# Patient Record
Sex: Female | Born: 1955 | Race: White | Hispanic: No | Marital: Single | Smoking: Never smoker
Health system: Southern US, Academic
[De-identification: ages and names within clinical notes are randomized; demographics above are authoritative.]

## PROBLEM LIST (undated history)

## (undated) DIAGNOSIS — E079 Disorder of thyroid, unspecified: Secondary | ICD-10-CM

## (undated) DIAGNOSIS — I4891 Unspecified atrial fibrillation: Secondary | ICD-10-CM

## (undated) DIAGNOSIS — E669 Obesity, unspecified: Secondary | ICD-10-CM

## (undated) HISTORY — DX: Unspecified atrial fibrillation (CMS HCC): I48.91

## (undated) HISTORY — PX: HX CATARACT REMOVAL: SHX102

## (undated) HISTORY — PX: HX TONSILLECTOMY: SHX27

---

## 1996-09-16 ENCOUNTER — Ambulatory Visit (HOSPITAL_COMMUNITY): Payer: Self-pay | Admitting: Family Medicine

## 2007-02-05 ENCOUNTER — Ambulatory Visit (INDEPENDENT_AMBULATORY_CARE_PROVIDER_SITE_OTHER): Payer: 59 | Admitting: Ophthalmology

## 2007-02-05 ENCOUNTER — Ambulatory Visit
Admission: RE | Admit: 2007-02-05 | Discharge: 2007-02-05 | Disposition: A | Discharge status: Home / Self Care | Attending: Ophthalmology | Admitting: Ophthalmology

## 2007-02-05 ENCOUNTER — Ambulatory Visit (INDEPENDENT_AMBULATORY_CARE_PROVIDER_SITE_OTHER): Admitting: Ophthalmology

## 2007-02-05 DIAGNOSIS — H43819 Vitreous degeneration, unspecified eye: Secondary | ICD-10-CM | POA: Insufficient documentation

## 2007-02-05 DIAGNOSIS — H547 Unspecified visual loss: Secondary | ICD-10-CM | POA: Insufficient documentation

## 2007-02-05 DIAGNOSIS — H25049 Posterior subcapsular polar age-related cataract, unspecified eye: Secondary | ICD-10-CM | POA: Insufficient documentation

## 2007-02-05 DIAGNOSIS — E119 Type 2 diabetes mellitus without complications: Secondary | ICD-10-CM | POA: Insufficient documentation

## 2007-02-05 DIAGNOSIS — E039 Hypothyroidism, unspecified: Secondary | ICD-10-CM | POA: Insufficient documentation

## 2007-02-05 DIAGNOSIS — H43399 Other vitreous opacities, unspecified eye: Secondary | ICD-10-CM | POA: Insufficient documentation

## 2007-03-03 ENCOUNTER — Ambulatory Visit
Admission: RE | Admit: 2007-03-03 | Discharge: 2007-03-03 | Disposition: A | Discharge status: Home / Self Care | Payer: 59 | Attending: Ophthalmology | Admitting: Ophthalmology

## 2007-03-03 ENCOUNTER — Encounter (HOSPITAL_COMMUNITY): Payer: Self-pay

## 2007-03-19 ENCOUNTER — Encounter (HOSPITAL_COMMUNITY)
Admission: RE | Disposition: A | Discharge status: Home / Self Care | Payer: Self-pay | Source: Ambulatory Visit | Attending: Ophthalmology

## 2007-03-19 ENCOUNTER — Ambulatory Visit (INDEPENDENT_AMBULATORY_CARE_PROVIDER_SITE_OTHER): Payer: 59 | Admitting: Ophthalmology

## 2007-03-19 ENCOUNTER — Inpatient Hospital Stay
Admission: RE | Admit: 2007-03-19 | Discharge: 2007-03-19 | Disposition: A | Discharge status: Home / Self Care | Payer: 59 | Attending: Ophthalmology | Admitting: Ophthalmology

## 2007-03-19 ENCOUNTER — Encounter (HOSPITAL_BASED_OUTPATIENT_CLINIC_OR_DEPARTMENT_OTHER): Admitting: Ophthalmology

## 2007-03-19 DIAGNOSIS — Z794 Long term (current) use of insulin: Secondary | ICD-10-CM | POA: Insufficient documentation

## 2007-03-19 DIAGNOSIS — E119 Type 2 diabetes mellitus without complications: Secondary | ICD-10-CM | POA: Insufficient documentation

## 2007-03-19 DIAGNOSIS — E669 Obesity, unspecified: Secondary | ICD-10-CM | POA: Insufficient documentation

## 2007-03-19 DIAGNOSIS — H2510 Age-related nuclear cataract, unspecified eye: Secondary | ICD-10-CM | POA: Insufficient documentation

## 2007-03-19 DIAGNOSIS — E079 Disorder of thyroid, unspecified: Secondary | ICD-10-CM | POA: Insufficient documentation

## 2007-03-19 LAB — PERFORM POC WHOLE BLOOD GLUCOSE: GLUCOSE, POINT OF CARE: 212 mg/dL — ABNORMAL HIGH (ref 70–105)

## 2007-03-19 SURGERY — PHACO WITH INTRAOCULAR LENS
Anesthesia: MAC TOPICAL | Site: Eye | Laterality: Right | Wound class: Clean Wound: Uninfected operative wounds in which no inflammation occurred

## 2007-03-19 MED ORDER — LACTATED RINGERS INTRAVENOUS SOLUTION
INTRAVENOUS | Status: DC
Start: 2007-03-19 — End: 2007-03-19

## 2007-03-19 MED ORDER — BALANCED SALT SOLUTION COMBINATION NO.2 INTRAOCULAR IRRIGATION
500.0000 mL | Freq: Once | INTRAOCULAR | Status: AC
Start: 2007-03-19 — End: 2007-03-19
  Administered 2007-03-19: 500 mL via OPHTHALMIC
  Filled 2007-03-19: qty 15

## 2007-03-19 MED ORDER — HELP MEDICATION
1.00 | Freq: Once | Status: AC
Start: 2007-03-19 — End: 2007-03-19
  Administered 2007-03-19: 1 via INTRAMUSCULAR
  Filled 2007-03-19: qty 1

## 2007-03-19 MED ORDER — PREDNISOLONE ACETATE 1 % EYE DROPS,SUSPENSION
1.00 [drp] | Freq: Four times a day (QID) | OPHTHALMIC | Status: DC
Start: 2007-03-19 — End: 2007-04-29

## 2007-03-19 MED ORDER — CYCLOPENTOLATE 1 % EYE DROPS
1.00 [drp] | OPHTHALMIC | Status: AC
Start: 2007-03-19 — End: 2007-03-19
  Administered 2007-03-19 (×3): 1 [drp] via OPHTHALMIC

## 2007-03-19 MED ORDER — MOXIFLOXACIN 0.5 % EYE DROPS
1.00 [drp] | OPHTHALMIC | Status: AC
Start: 2007-03-19 — End: 2007-03-19
  Administered 2007-03-19 (×3): 1 [drp] via OPHTHALMIC

## 2007-03-19 MED ORDER — MOXIFLOXACIN 0.5 % EYE DROPS
1.00 [drp] | Freq: Four times a day (QID) | OPHTHALMIC | Status: DC
Start: 2007-03-19 — End: 2007-04-29

## 2007-03-19 MED ORDER — BALANCED SALT SOLUTION COMBINATION NO.2 INTRAOCULAR IRRIGATION
15.0000 mL | Freq: Once | INTRAOCULAR | Status: AC
Start: 2007-03-19 — End: 2007-03-19
  Administered 2007-03-19: 15 mL via OPHTHALMIC
  Filled 2007-03-19: qty 15

## 2007-03-19 MED ORDER — PHENYLEPHRINE 2.5 % EYE DROPS
1.00 [drp] | Freq: Once | OPHTHALMIC | Status: AC
Start: 2007-03-19 — End: 2007-03-19
  Administered 2007-03-19 (×3): 1 [drp] via OPHTHALMIC

## 2007-03-19 SURGICAL SUPPLY — 6 items
GARTER EYE ASST_28-6536 (OPHTHALMIC SUPPLIES (NOT LENS)) ×1 IMPLANT
LENS IOL 10 D +19 DIOP MOD C BICONVEX ACRYSOF MLPC FLD ANT  ASYM UV ABS MNFLX 13MM 6MM POST CHAMBER (Lens) ×1 IMPLANT
PACK CATARACT BX/6 (TRAY) ×1 IMPLANT
PAD EYE 2.62X1.62IN SURECARE COTTON ABS LF  STRL (OPHTHALMIC SUPPLIES (NOT LENS)) IMPLANT
PEN SURG MRKNG WRITESITE + RLR LBL SET 3X DARKER FORMULATE GNTN VIOL INK STRL LF  CHLRPRP (MISCELLANEOUS PT CARE ITEMS) ×1 IMPLANT
SHIELD EYE 76.2X60.3MM ASST AL EY GRTR FOX CVR (OPHTHALMIC SUPPLIES (NOT LENS)) ×1 IMPLANT

## 2007-03-19 NOTE — OR Nursing (Signed)
Cde   40.60

## 2007-03-19 NOTE — Discharge Instructions (Signed)
SURGICAL DISCHARGE INSTRUCTIONS     Dr. Brett Fairy  performed your [facility] WITH INTRAOCULAR LENS today at the [facility]    [facility]:  Monday through Friday from 6 a.m. - 7 p.m.: [phone]  Between 7 p.m. - 6 a.m., weekends and holidays:  Call [facility] at [phone] or [phone].    PLEASE SEE WRITTEN HANDOUTS AS DISCUSSED BY YOUR NURSE:  cataract    SIGNS AND SYMPTOMS OF A WOUND / INCISION INFECTION   Be sure to watch for the following:   Increase in redness or red streaks near or around the wound or incision.  Increase in pain that is intense or severe and cannot be relieved by the pain medication that your doctor has given you.  Increase in swelling that cannot be relieved by elevation of a body part, or by applying ice, if permitted.  Increase in drainage, or if yellow / green in color and smells bad. This could be on a dressing or a cast.  Increase in fever for longer than 24 hours, or an increase that is higher than 101 degrees Fahrenheit (normal body temperature is 98 degrees Fahrenheit). The incision may feel warm to the touch.    **CALL YOUR DOCTOR IF ONE OR MORE OF THESE SIGNS / SYMPTOMS SHOULD OCCUR.    ANESTHESIA INFORMATION   LOCAL ANESTHETIC:  You have receieved a local anesthetic, the effects should disappear in a few hours.    REMEMBER   If you experience any difficulty breathing, chest pain, bleeding that you feel is excessive, persistent nausea or vomiting or for any other concerns:  Call your physician Dr. Sheppard Evens [phone] or [phone]. You may also ask to have theOphthalmology doctor on call paged. They are available to you 24 hours a day.    SPECIAL INSTRUCTIONS / COMMENTS   Start drops today    FOLLOW-UP APPOINTMENTS   Please call patient services at [phone] or [phone] to schedule a date / time of return. They are open Monday - Friday from 7:30 am - 5:00 pm.

## 2007-03-19 NOTE — OR Surgeon (Signed)
[facility]   DEPARTMENT OF OPHTHALMOLOGY    OPERATION SUMMARY    PATIENT NAME: Rachel Mccall, Rachel Mccall  HOSPITAL ZOXWRU:[id number]  DATE OF SERVICE:03/19/2007  DATE OF BIRTH: 10/26/1955    PREOPERATIVE DIAGNOSIS: Cataract, right eye.    POSTOPERATIVE DIAGNOSIS: Cataract, right eye.    NAME OF PROCEDURE: Phacoemulsification of cataract with posterior chamber lens implant, right eye.    SURGEONS: Kathleen Argue MD (staff), Aurther Loft MD    ANESTHESIA: MAC, topical.    ESTIMATED BLOOD LOSS: Less than 2 mL.    COMPLICATIONS: There were no complications.    SPECIMENS: None.    DESCRIPTION OF PROCEDURE: In the preanesthesia area, the patient was given a topical anesthetic consisting of 2% Xylocaine jelly applied to the right eye. The patient was taken back to the operating room suite, where the right eye was prepped and draped in a routine sterile fashion. A speculum was placed in the eye. A keratome was used to make a 1-mm side port entry through clear cornea at the 11 o'clock position along the limbus, and 1 mL of 1% preservative-free Xylocaine was then irrigated into the anterior chamber. The anterior chamber was filled with viscoelastic material. The keratome was used to make a 2.4-mm phaco port incision through clear cornea at the 9 o'clock position along the limbus. The cystotome and capsulorrhexis forceps were used to create a continuous curvilinear capsulorrhexis. Hydrodissection was performed. The tip of the phacoemulsification handpiece was introduced into the eye, and the lens nucleus was emulsified using a bimanual nuclear splitting technique. The cumulative dispersive energy was 40.60. The phaco tip was removed and the tip of the I/A handpiece introduced into the eye. The remaining lens cortical material was aspirated from the eye. The I/A tip was removed, and the capsular bag was inflated with viscoelastic material. The posterior chamber lens implant of +19.0 diopters power, [id number],  serial #[id number], was brought onto the field and inspected. It was without defect and so loaded into the lens injector. The tip of the injector was inserted into the eye, and the lens implant was placed in the capsular bag. The injector tip was removed and the tip of the I/A handpiece reintroduced into the eye. The remaining viscoelastic material was aspirated from the eye. The I/A tip was removed, and the anterior chamber was pressurized with balanced salt solution. Weck-cel sponges confirmed no leak at the incision, so no sutures were necessary. The speculum was removed from the eye, and the eye was bandaged with a rigid shield. The patient was returned to same day surgery in excellent condition. Dr. Christell Constant was present for and participated in the entire procedure.      Aurther Loft, MD  Resident/[facility]    Brett Fairy, MD  Assistant Professor  [facility]hthal[facility]    MM/fkp/[id number];D: 03/19/2007 09:14:00; T: 03/19/2007 19:29:33

## 2007-03-26 ENCOUNTER — Encounter (INDEPENDENT_AMBULATORY_CARE_PROVIDER_SITE_OTHER): Payer: 59 | Admitting: Ophthalmology

## 2007-04-28 ENCOUNTER — Encounter (HOSPITAL_COMMUNITY): Payer: Self-pay

## 2007-04-29 ENCOUNTER — Encounter (HOSPITAL_BASED_OUTPATIENT_CLINIC_OR_DEPARTMENT_OTHER): Payer: 59 | Admitting: Ophthalmology

## 2007-04-29 ENCOUNTER — Encounter (HOSPITAL_COMMUNITY)
Admission: RE | Disposition: A | Discharge status: Home / Self Care | Payer: Self-pay | Source: Ambulatory Visit | Attending: Ophthalmology

## 2007-04-29 ENCOUNTER — Inpatient Hospital Stay
Admission: RE | Admit: 2007-04-29 | Discharge: 2007-04-29 | Disposition: A | Discharge status: Home / Self Care | Payer: 59 | Attending: Ophthalmology | Admitting: Ophthalmology

## 2007-04-29 DIAGNOSIS — Z794 Long term (current) use of insulin: Secondary | ICD-10-CM | POA: Insufficient documentation

## 2007-04-29 DIAGNOSIS — E669 Obesity, unspecified: Secondary | ICD-10-CM | POA: Insufficient documentation

## 2007-04-29 DIAGNOSIS — H269 Unspecified cataract: Secondary | ICD-10-CM | POA: Insufficient documentation

## 2007-04-29 DIAGNOSIS — E119 Type 2 diabetes mellitus without complications: Secondary | ICD-10-CM | POA: Insufficient documentation

## 2007-04-29 DIAGNOSIS — E079 Disorder of thyroid, unspecified: Secondary | ICD-10-CM | POA: Insufficient documentation

## 2007-04-29 HISTORY — DX: Disorder of thyroid, unspecified: E07.9

## 2007-04-29 HISTORY — DX: Obesity, unspecified: E66.9

## 2007-04-29 LAB — PERFORM POC WHOLE BLOOD GLUCOSE: GLUCOSE, POINT OF CARE: 226 mg/dL — ABNORMAL HIGH (ref 70–105)

## 2007-04-29 SURGERY — PHACO WITH INTRAOCULAR LENS
Anesthesia: MAC TOPICAL | Site: Eye | Laterality: Left | Wound class: Clean Wound: Uninfected operative wounds in which no inflammation occurred

## 2007-04-29 MED ORDER — LIDOCAINE HCL 2 % MUCOSAL JELLY
Freq: Once | Status: AC
Start: 2007-04-29 — End: 2007-04-29

## 2007-04-29 MED ORDER — CYCLOPENTOLATE 1 % EYE DROPS
1.00 [drp] | OPHTHALMIC | Status: DC | PRN
Start: 2007-04-29 — End: 2007-04-29
  Administered 2007-04-29 (×3): 1 [drp] via OPHTHALMIC

## 2007-04-29 MED ORDER — MOXIFLOXACIN 0.5 % EYE DROPS
1.00 [drp] | Freq: Four times a day (QID) | OPHTHALMIC | Status: DC
Start: 2007-04-29 — End: 2007-06-25

## 2007-04-29 MED ORDER — MOXIFLOXACIN 0.5 % EYE DROPS
1.00 [drp] | OPHTHALMIC | Status: DC | PRN
Start: 2007-04-29 — End: 2007-04-29
  Administered 2007-04-29 (×3): 1 [drp] via OPHTHALMIC

## 2007-04-29 MED ORDER — PHENYLEPHRINE 2.5 % EYE DROPS
1.00 [drp] | OPHTHALMIC | Status: DC | PRN
Start: 2007-04-29 — End: 2007-04-29
  Administered 2007-04-29 (×3): 1 [drp] via OPHTHALMIC

## 2007-04-29 MED ORDER — BALANCED SALT SOLUTION COMBINATION NO.2 INTRAOCULAR IRRIGATION
15.00 mL | Freq: Once | INTRAOCULAR | Status: DC | PRN
Start: 2007-04-29 — End: 2007-04-29
  Administered 2007-04-29: 15 mL via OPHTHALMIC
  Filled 2007-04-29: qty 15

## 2007-04-29 MED ORDER — BALANCED SALT SOLUTION COMBINATION NO.2 INTRAOCULAR IRRIGATION
INTRAOCULAR | Status: DC
Start: 2007-04-29 — End: 2007-04-29
  Administered 2007-04-29: 500 mL via INTRAOCULAR

## 2007-04-29 MED ORDER — LACTATED RINGERS INTRAVENOUS SOLUTION
INTRAVENOUS | Status: DC
Start: 2007-04-29 — End: 2007-04-29

## 2007-04-29 MED ORDER — PREDNISOLONE ACETATE 1 % EYE DROPS,SUSPENSION
1.00 [drp] | Freq: Four times a day (QID) | OPHTHALMIC | Status: DC
Start: 2007-04-29 — End: 2007-06-25

## 2007-04-29 SURGICAL SUPPLY — 5 items
GARTER EYE ASST_28-6536 (OPHTHALMIC SUPPLIES (NOT LENS)) ×1 IMPLANT
LENS IOL 10 D +20.5 DIOP MOD C BICONVEX ACRYSOF MLPC FLD ANT  ASYM UV ABS MNFLX 13MM 6MM POST (Lens) ×1 IMPLANT
PACK CATARACT BX/6 (TRAY) ×1 IMPLANT
PAD EYE 2.62X1.62IN SURECARE COTTON ABS LF  STRL (OPHTHALMIC SUPPLIES (NOT LENS)) ×2 IMPLANT
SHIELD EYE 76.2X60.3MM ASST AL EY GRTR FOX CVR (OPHTHALMIC SUPPLIES (NOT LENS)) ×1 IMPLANT

## 2007-04-29 NOTE — OR Nursing (Signed)
CDE  49.63  DUOVISC 1.05 ML INTRAOCCULAR TO LEFT EYE BY [place]

## 2007-04-29 NOTE — OR PostOp (Signed)
Left via W/C with all belongings.  No changes/stable. Per D.Sharpton SA

## 2007-04-29 NOTE — Discharge Instructions (Signed)
SURGICAL DISCHARGE INSTRUCTIONS     Dr. Charles A Moore  performed your [facility] WITH INTRAOCULAR LENS today at the [facility]    [facility]:  Monday through Friday from 6 a.m. - 7 p.m.: [phone]  Between 7 p.m. - 6 a.m., weekends and holidays:  Call [facility] at [phone] or [phone].    PLEASE SEE WRITTEN HANDOUTS AS DISCUSSED BY YOUR NURSE:  Cataract surgery    SIGNS AND SYMPTOMS OF A WOUND / INCISION INFECTION   Be sure to watch for the following:  · Increase in redness or red streaks near or around the wound or incision.  Increase in pain that is intense or severe and cannot be relieved by the pain medication that your doctor has given you.  Increase in swelling that cannot be relieved by elevation of a body part, or by applying ice, if permitted.  Increase in drainage, or if yellow / green in color and smells bad. This could be on a dressing or a cast.  Increase in fever for longer than 24 hours, or an increase that is higher than 101 degrees Fahrenheit (normal body temperature is 98 degrees Fahrenheit). The incision may feel warm to the touch.    **CALL YOUR DOCTOR IF ONE OR MORE OF THESE SIGNS / SYMPTOMS SHOULD OCCUR.    ANESTHESIA INFORMATION   ANESTHESIA -- ADULT PATIENTS:  You have received intravenous sedation / general anesthesia, and you may feel drowsy and light-headed for several hours. You may even experience some forgetfulness of the procedure. DO NOT DRIVE A MOTOR VEHICLE or perform any activity requiring complete alertness or coordination until you feel fully awake in about 24-48 hours. Do not drink alcoholic beverages for at least 24 hours. Do not stay alone, you must have a responsible adult available to be with you. You may also experience a dry mouth or nausea for 24 hours. This is a normal side effect and will disappear as the effects of the medication wear off.    REMEMBER   If you experience any difficulty breathing, chest pain, bleeding that you  feel is excessive, persistent nausea or vomiting or for any other concerns:  Call your physician Dr. Moore at [phone] or [phone]. You may also ask to have the eye doctor on call paged. They are available to you 24 hours a day.    SPECIAL INSTRUCTIONS / COMMENTS       FOLLOW-UP APPOINTMENTS   Please call patient services at [phone] or [phone] to schedule a date / time of return. They are open Monday - Friday from 7:30 am - 5:00 pm.

## 2007-04-29 NOTE — OR Surgeon (Signed)
[facility]                                  DEPARTMENT OF OPHTHALMOLOGY                                         OPERATION SUMMARY    PATIENT NAME: Rachel Mccall, Rachel Mccall  HOSPITAL ZOXWRU:[id number]  DATE OF SERVICE:04/29/2007  DATE OF BIRTH: July 24, 1955    PREOPERATIVE DIAGNOSIS:  Cataract, left eye.     POSTOPERATIVE DIAGNOSIS:  Cataract, left eye.     NAME OF PROCEDURE:  Phacoemulsification of cataract with posterior chamber lens implant, left eye.     SURGEONS:  Kathleen Argue MD (staff), Drue Stager MD (resident).     ANESTHESIA:  Lidocaine gel with MAC.     ESTIMATED BLOOD LOSS:  Less than 2 mL.     COMPLICATIONS:  There were no complications.     SPECIMENS:  None.     DESCRIPTION OF PROCEDURE:  In the preanesthesia area, the patient was given a topical anesthetic consisting of 2% Xylocaine jelly applied to the left eye.  The patient was taken back to the operating room suite, where the left eye was prepped and draped in a routine sterile fashion.  A speculum was placed in the eye.  A keratome was used to make a 1-mm side port entry through clear cornea at the 5 o'clock position along the limbus, and 1 mL of 1% preservative-free Xylocaine was then irrigated into the anterior chamber.  The anterior chamber was filled with viscoelastic material.  The keratome was used to make a 2.4-mm phaco port incision through clear cornea at the 3 o'clock position along the limbus.  The cystotome and capsulorrhexis forceps were used to create a continuous curvilinear capsulorrhexis.  Hydrodissection was performed.  The tip of the phacoemulsification handpiece was introduced into the eye, and the lens nucleus was emulsified using a bimanual nuclear splitting technique.  The cumulative dispersive energy was 49.63.  The phaco tip was removed and the tip of the I/A handpiece introduced into the eye.  The remaining lens cortical material was aspirated from the eye.  The I/A tip was  removed, and the capsular bag was inflated with viscoelastic material.  The posterior chamber lens implant of 25.5 diopters power was brought onto the field and inspected.  It was without defect and so loaded into the lens injector.  The tip of the injector was inserted into the eye, and the lens implant was placed in the capsular bag.  The injector tip was removed and the tip of the I/A handpiece reintroduced into the eye.  The remaining viscoelastic material was aspirated from the eye.  The I/A tip was removed, and the anterior chamber was pressurized with balanced salt solution.  Weck-cel sponges confirmed no leak at the incision, so no sutures were necessary.  The speculum was removed from the eye, and the eye was bandaged with a rigid shield.  The patient was returned to same day surgery in excellent condition.  Dr. Christell Constant was present for and participated in the entire procedure.      Drue Stager, MD  Resident  [facility] of Ophthalmology    Brett Fairy, MD  Assistant Professor  [facility] Op[facility]    HE/csm/[id number];D: 04/29/2007 12:00:09; T:  04/29/2007 12:20:45

## 2007-04-30 ENCOUNTER — Ambulatory Visit: Payer: 59 | Attending: Ophthalmology | Admitting: Ophthalmology

## 2007-05-07 ENCOUNTER — Encounter (INDEPENDENT_AMBULATORY_CARE_PROVIDER_SITE_OTHER): Payer: 59 | Admitting: Ophthalmology

## 2007-06-25 ENCOUNTER — Ambulatory Visit: Payer: 59 | Admitting: Ophthalmology

## 2007-07-06 ENCOUNTER — Ambulatory Visit (HOSPITAL_COMMUNITY): Payer: Self-pay

## 2009-03-27 ENCOUNTER — Ambulatory Visit (INDEPENDENT_AMBULATORY_CARE_PROVIDER_SITE_OTHER): Admitting: Otolaryngology

## 2009-05-22 ENCOUNTER — Encounter (INDEPENDENT_AMBULATORY_CARE_PROVIDER_SITE_OTHER): Payer: Self-pay | Admitting: Otolaryngology

## 2009-05-22 ENCOUNTER — Ambulatory Visit (INDEPENDENT_AMBULATORY_CARE_PROVIDER_SITE_OTHER): Payer: 59 | Admitting: Otolaryngology

## 2009-05-22 VITALS — Ht 68.0 in | Wt 314.0 lb

## 2009-05-22 NOTE — H&P (Signed)
See dictated note.

## 2009-05-23 NOTE — Progress Notes (Signed)
[facility] Department of Otolaryngology  [address]  [place], [place] [address]      PROGRESS NOTE    PATIENT NAME: Rachel Mccall, Rachel Mccall  CHART NUMBER: [id number]  DATE OF BIRTH: [birth year]  DATE OF SERVICE: 05/22/2009    [facility] ENT  [facility]  [address]   [place], [place] [address]   Telephone: [phone]   Fax: [phone]     SUBJECTIVE: This lady has had a history of ear infections that has been going on for 3-4 months with some dizziness when these occur. She was told she had ear fluid and was treated with antibiotic therapy. She is diabetic with dietary control and does take meclizine as needed. This is more of a lightheadedness. She does state some degree of ear pain when these episodes occur.    OBJECTIVE: Alert, cooperative. Head: Symmetrical. Eyes unremarkable, no horizontal nystagmus. Gait and balance are all clear. Ears: TMs are clear. Nasal area patent and open. Mouth and oral cavity unremarkable. Nasopharynx: No exudate. Indirect laryngoscopy unremarkable. Neck: No adenopathy or salivary gland enlargement.    ASSESSMENT: Essentially normal ENT exam at this time.    PLAN: We will see her back in five months or if she has another episode, and we can try to assemble the facts at that time and see what is happening. She understands and will return as scheduled.      Vilinda Flake, MD  Assistant Professor  [facility] Department of Otolaryngology    JJ/OA/[id number]; D: 05/22/2009 14:14:22; T: 05/22/2009 14:31:05

## 2009-09-25 ENCOUNTER — Encounter (INDEPENDENT_AMBULATORY_CARE_PROVIDER_SITE_OTHER): Payer: 59 | Admitting: Otolaryngology

## 2009-10-30 ENCOUNTER — Encounter (INDEPENDENT_AMBULATORY_CARE_PROVIDER_SITE_OTHER): Admitting: Otolaryngology

## 2009-11-06 ENCOUNTER — Encounter (INDEPENDENT_AMBULATORY_CARE_PROVIDER_SITE_OTHER): Payer: Self-pay | Admitting: Otolaryngology

## 2011-01-28 HISTORY — PX: HX ANKLE FRACTURE TX: SHX122

## 2012-09-03 HISTORY — PX: HX YAG: 210000615

## 2012-09-09 HISTORY — PX: HX YAG: 210000615

## 2014-01-17 DIAGNOSIS — I87332 Chronic venous hypertension (idiopathic) with ulcer and inflammation of left lower extremity: Secondary | ICD-10-CM

## 2014-01-17 DIAGNOSIS — L97821 Non-pressure chronic ulcer of other part of left lower leg limited to breakdown of skin: Secondary | ICD-10-CM

## 2014-01-24 DIAGNOSIS — L97821 Non-pressure chronic ulcer of other part of left lower leg limited to breakdown of skin: Secondary | ICD-10-CM

## 2014-02-15 DIAGNOSIS — L97821 Non-pressure chronic ulcer of other part of left lower leg limited to breakdown of skin: Secondary | ICD-10-CM

## 2014-02-22 DIAGNOSIS — L97821 Non-pressure chronic ulcer of other part of left lower leg limited to breakdown of skin: Secondary | ICD-10-CM

## 2014-03-01 DIAGNOSIS — L97821 Non-pressure chronic ulcer of other part of left lower leg limited to breakdown of skin: Secondary | ICD-10-CM

## 2014-03-22 DIAGNOSIS — L97821 Non-pressure chronic ulcer of other part of left lower leg limited to breakdown of skin: Secondary | ICD-10-CM

## 2014-12-07 DIAGNOSIS — L97821 Non-pressure chronic ulcer of other part of left lower leg limited to breakdown of skin: Secondary | ICD-10-CM

## 2014-12-07 DIAGNOSIS — I87332 Chronic venous hypertension (idiopathic) with ulcer and inflammation of left lower extremity: Secondary | ICD-10-CM

## 2014-12-14 DIAGNOSIS — L97821 Non-pressure chronic ulcer of other part of left lower leg limited to breakdown of skin: Secondary | ICD-10-CM

## 2014-12-28 DIAGNOSIS — L97821 Non-pressure chronic ulcer of other part of left lower leg limited to breakdown of skin: Secondary | ICD-10-CM

## 2015-01-18 DIAGNOSIS — L97821 Non-pressure chronic ulcer of other part of left lower leg limited to breakdown of skin: Secondary | ICD-10-CM

## 2015-01-23 DIAGNOSIS — L97821 Non-pressure chronic ulcer of other part of left lower leg limited to breakdown of skin: Secondary | ICD-10-CM

## 2015-01-30 DIAGNOSIS — L97821 Non-pressure chronic ulcer of other part of left lower leg limited to breakdown of skin: Secondary | ICD-10-CM

## 2015-02-08 DIAGNOSIS — L97821 Non-pressure chronic ulcer of other part of left lower leg limited to breakdown of skin: Secondary | ICD-10-CM

## 2015-02-15 DIAGNOSIS — L97821 Non-pressure chronic ulcer of other part of left lower leg limited to breakdown of skin: Secondary | ICD-10-CM

## 2015-02-22 DIAGNOSIS — L97821 Non-pressure chronic ulcer of other part of left lower leg limited to breakdown of skin: Secondary | ICD-10-CM

## 2015-03-01 DIAGNOSIS — L97821 Non-pressure chronic ulcer of other part of left lower leg limited to breakdown of skin: Secondary | ICD-10-CM

## 2015-03-08 DIAGNOSIS — L97821 Non-pressure chronic ulcer of other part of left lower leg limited to breakdown of skin: Secondary | ICD-10-CM

## 2015-03-16 DIAGNOSIS — L97821 Non-pressure chronic ulcer of other part of left lower leg limited to breakdown of skin: Secondary | ICD-10-CM

## 2015-03-22 DIAGNOSIS — L97821 Non-pressure chronic ulcer of other part of left lower leg limited to breakdown of skin: Secondary | ICD-10-CM

## 2015-03-29 DIAGNOSIS — L97821 Non-pressure chronic ulcer of other part of left lower leg limited to breakdown of skin: Secondary | ICD-10-CM

## 2015-04-06 ENCOUNTER — Ambulatory Visit (INDEPENDENT_AMBULATORY_CARE_PROVIDER_SITE_OTHER): Payer: Self-pay | Admitting: Medical

## 2015-04-06 ENCOUNTER — Ambulatory Visit (INDEPENDENT_AMBULATORY_CARE_PROVIDER_SITE_OTHER): Payer: No Typology Code available for payment source

## 2015-04-06 ENCOUNTER — Encounter (INDEPENDENT_AMBULATORY_CARE_PROVIDER_SITE_OTHER): Payer: Self-pay | Admitting: Medical

## 2015-04-06 VITALS — BP 152/84 | HR 75 | Ht 68.0 in | Wt 302.0 lb

## 2015-04-06 DIAGNOSIS — L97329 Non-pressure chronic ulcer of left ankle with unspecified severity: Secondary | ICD-10-CM

## 2015-04-06 DIAGNOSIS — I83023 Varicose veins of left lower extremity with ulcer of ankle: Secondary | ICD-10-CM

## 2015-04-06 DIAGNOSIS — I872 Venous insufficiency (chronic) (peripheral): Secondary | ICD-10-CM

## 2015-04-06 NOTE — Progress Notes (Addendum)
[facility]  [address]  [place] [address]  Phone: [phone]  Fax: [phone]    Encounter Date: 04/06/2015    Patient ID:  Rachel Mccall  QIO:[id number]    DOB: 07-27-1955  Age: 60 y.o. female    Subjective:     Chief Complaint   Patient presents with    Venous Peripheral Insufficiency     room 3; left leg unnaboot, no recent testing     HPI   Patient is a 60 y.o. female who was seen today for evaluation of lower extremity swelling and left ankle ulcer. She has a left ankle ulcer started after she broke her left ankle in September 2015. She says the ulcer will worsen intermittently It has never fully healed.  She was previously using Medihoney with some improvement. Since January, she has been following with Dr. Lanae Boast at the [facility]. She is currently on serial Unna boot therapy and she says that the ulcer is looking much better, but still continues to drain.        Current Outpatient Prescriptions   Medication Sig    glipiZIDE (GLUCOTROL) 5 mg Tab take 5 mg by mouth Daily before Breakfast. Take 30 minutes before meals     levothyroxine (SYNTHROID) 75 mcg Tab take 1 Tab by mouth Once a day.     lisinopril (PRINIVIL) 2.5 mg Tab take 2.5 mg by mouth Once a day.    metformin (GLUCOPHAGE) 500 mg Tab take 1 Tab by mouth Twice daily with food.      Allergies   Allergen Reactions    Bactrim [Sulfamethoxazole-Trimethoprim]     Penicillins Rash     Past Medical History:   Diagnosis Date    Diabetes     Obesity     Thyroid disease          Past Surgical History:   Procedure Laterality Date    HX CATARACT REMOVAL      OD    HX TONSILLECTOMY           Family History   Problem Relation Age of Onset    Diabetes Other     Thyroid Disease Other     Cancer Mother     Diabetes Father     Hypertension Father     Cancer Sister     Cancer Brother          Social History   Substance Use Topics    Smoking status: Never Smoker    Smokeless tobacco: None     Alcohol use 0.5 oz/week     1 Standard drinks or equivalent per week       Review of Systems      Constitutional Negative for: Fever           HENT Negative for: Headaches, Nosebleeds     Eyes Negative for: Blurred Vision     Cardiovascular Negative for: Chest Pain, Leg Swelling     Respiratory Negative for: Cough, Shortness of Breath     Gastrointestinal Negative for: Abdominal Pain, Blood in Stool     Genitourinary Negative for: Hematuria     Musculoskeletal Negative for: Falls     Endo/Heme/Allergy Negative for: Easy Bruise/Bleed     Neurological Negative for : Dizziness, Focal Weakness, Seizures, Speech Change     Psychological Negative for: Depression, Suicidal Ideas, Substance Abuse, Memory Loss      Objective:   Vitals:  Visit Vitals    BP (!) 152/84    Pulse 75    Ht 1.727 m (5\' 8" )    Wt (!) 137 kg (302 lb)    SpO2 96%    BMI 45.92 kg/m2       Physical Exam   Constitutional: She is oriented to person, place, and time. She appears well-developed and well-nourished. No distress.   Cardiovascular: Normal rate, regular rhythm, normal heart sounds and intact distal pulses.    Pulmonary/Chest: Effort normal and breath sounds normal. No respiratory distress. She has no wheezes. She has no rales.   Abdominal: Soft. Bowel sounds are normal.   Musculoskeletal: Normal range of motion. She exhibits edema.   Neurological: She is alert and oriented to person, place, and time. No cranial nerve deficit.   Skin: Skin is warm and dry. She is not diaphoretic.        Vitals reviewed.    Assessment & Plan:     ENCOUNTER DIAGNOSES     ICD-10-CM   1. Venous insufficiency of both lower extremities I87.2   2. 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 All of the patient's questions were answered. The patient agreed to proceed with the recommended treatment and appropriate consents were obtained.    Patient will continue follow-up for [facility] until surgery        Orders Placed This Encounter    VENOUS DUPLEX EXTREMITY/BILATERAL/COMPLETE (AMB ONLY)         Brandy Straface, PA-C    The history, physical exam was directly supervised by me. The interpretation of ancillary tests was performed by me. I also have  reviewed and confirmed the ROS, Past Medical Histoy, Past Family History, and exam elements performed and documented by the support staff. The scribed portion of the progress note was scribed on my behalf and at my direction.  I have reviewed and attest to the accuracy of the note.    Garfield CorneaJohn A Caydin Yeatts, MD FACS

## 2015-04-12 DIAGNOSIS — L97821 Non-pressure chronic ulcer of other part of left lower leg limited to breakdown of skin: Secondary | ICD-10-CM

## 2015-04-13 NOTE — Procedures (Signed)
PROCEDURE:  Bilateral lower extremity venous Ultrasound with color flow imaging and compression augmentation maneuvers was performed Using standard protocols. Bilateral Common Femoral, Femoral, Popliteal and deep tibial Veins Were Evaluated. The Greater Saphenous veins and the Lesser Saphenous Veins were also evaluated bilaterally for flow, caliber, patency and valvular function        FINDINGS: Ultrasound shows severe saphenofemoral junction reflux on the right. There is mild to moderate left greater saphenous vein reflux. Low probability for deep vein thrombosis.      ASSESSMENT:   Severe right greater saphenous vein reflux.   Mild to moderate left greater saphenous vein reflux.   Low probability for deep vein thrombosis.       I am scribing for Dr. Sherilyn DacostaJohn Lourie Retz for services provided on 04/06/2015.  Carmeleta Duke SalviaRandolph, Scribe  This test was done under my direct supervision.The review and interpretation of this ancillary tests were performed by me. I also have  reviewed  elements performed and documented by the support staff. The scribed portion of the progress note was scribed on my behalf and at my direction.  I have reviewed and attest to the accuracy of the note.    Garfield CorneaJohn A Jolyssa Oplinger, MD, FACS  .test

## 2015-04-19 DIAGNOSIS — L97821 Non-pressure chronic ulcer of other part of left lower leg limited to breakdown of skin: Secondary | ICD-10-CM

## 2015-04-26 DIAGNOSIS — L97821 Non-pressure chronic ulcer of other part of left lower leg limited to breakdown of skin: Secondary | ICD-10-CM

## 2015-05-04 DIAGNOSIS — L97821 Non-pressure chronic ulcer of other part of left lower leg limited to breakdown of skin: Secondary | ICD-10-CM

## 2015-05-07 ENCOUNTER — Encounter (INDEPENDENT_AMBULATORY_CARE_PROVIDER_SITE_OTHER): Payer: Self-pay | Admitting: Medical

## 2015-05-07 ENCOUNTER — Encounter (INDEPENDENT_AMBULATORY_CARE_PROVIDER_SITE_OTHER): Payer: No Typology Code available for payment source | Admitting: Medical

## 2015-05-08 ENCOUNTER — Ambulatory Visit (INDEPENDENT_AMBULATORY_CARE_PROVIDER_SITE_OTHER): Payer: Self-pay | Admitting: Vascular Surgery

## 2015-05-08 NOTE — Telephone Encounter (Signed)
Pt was scheduled to see Rachel Mccall on Tues 4/18 due to [facility] Ablation surgery being [facility]15 that is why the procedure was not covered. LMOM. [facility] and then can have her surgery with us or Rachel FritzPillai

## 2015-05-10 DIAGNOSIS — L97821 Non-pressure chronic ulcer of other part of left lower leg limited to breakdown of skin: Secondary | ICD-10-CM

## 2015-05-15 ENCOUNTER — Ambulatory Visit (INDEPENDENT_AMBULATORY_CARE_PROVIDER_SITE_OTHER): Admitting: Vascular Surgery

## 2015-05-23 DIAGNOSIS — B952 Enterococcus as the cause of diseases classified elsewhere: Secondary | ICD-10-CM

## 2015-05-23 DIAGNOSIS — I87332 Chronic venous hypertension (idiopathic) with ulcer and inflammation of left lower extremity: Secondary | ICD-10-CM

## 2015-05-23 DIAGNOSIS — L97821 Non-pressure chronic ulcer of other part of left lower leg limited to breakdown of skin: Secondary | ICD-10-CM

## 2015-05-30 DIAGNOSIS — I87332 Chronic venous hypertension (idiopathic) with ulcer and inflammation of left lower extremity: Secondary | ICD-10-CM

## 2015-05-30 DIAGNOSIS — L97821 Non-pressure chronic ulcer of other part of left lower leg limited to breakdown of skin: Secondary | ICD-10-CM

## 2015-06-07 DIAGNOSIS — L97821 Non-pressure chronic ulcer of other part of left lower leg limited to breakdown of skin: Secondary | ICD-10-CM

## 2015-06-14 DIAGNOSIS — L97821 Non-pressure chronic ulcer of other part of left lower leg limited to breakdown of skin: Secondary | ICD-10-CM

## 2015-06-20 DIAGNOSIS — L97821 Non-pressure chronic ulcer of other part of left lower leg limited to breakdown of skin: Secondary | ICD-10-CM

## 2015-06-20 DIAGNOSIS — I87332 Chronic venous hypertension (idiopathic) with ulcer and inflammation of left lower extremity: Secondary | ICD-10-CM

## 2015-06-27 DIAGNOSIS — I87332 Chronic venous hypertension (idiopathic) with ulcer and inflammation of left lower extremity: Secondary | ICD-10-CM

## 2015-06-27 DIAGNOSIS — L97821 Non-pressure chronic ulcer of other part of left lower leg limited to breakdown of skin: Secondary | ICD-10-CM

## 2015-07-04 DIAGNOSIS — I87332 Chronic venous hypertension (idiopathic) with ulcer and inflammation of left lower extremity: Secondary | ICD-10-CM

## 2015-07-04 DIAGNOSIS — L97821 Non-pressure chronic ulcer of other part of left lower leg limited to breakdown of skin: Secondary | ICD-10-CM

## 2015-07-11 DIAGNOSIS — L97821 Non-pressure chronic ulcer of other part of left lower leg limited to breakdown of skin: Secondary | ICD-10-CM

## 2015-07-18 DIAGNOSIS — L97821 Non-pressure chronic ulcer of other part of left lower leg limited to breakdown of skin: Secondary | ICD-10-CM

## 2015-07-25 DIAGNOSIS — L97821 Non-pressure chronic ulcer of other part of left lower leg limited to breakdown of skin: Secondary | ICD-10-CM

## 2015-07-25 DIAGNOSIS — I87332 Chronic venous hypertension (idiopathic) with ulcer and inflammation of left lower extremity: Secondary | ICD-10-CM

## 2015-08-01 DIAGNOSIS — L97821 Non-pressure chronic ulcer of other part of left lower leg limited to breakdown of skin: Secondary | ICD-10-CM

## 2015-08-08 DIAGNOSIS — I87332 Chronic venous hypertension (idiopathic) with ulcer and inflammation of left lower extremity: Secondary | ICD-10-CM

## 2015-08-08 DIAGNOSIS — L97821 Non-pressure chronic ulcer of other part of left lower leg limited to breakdown of skin: Secondary | ICD-10-CM

## 2015-08-16 DIAGNOSIS — L97821 Non-pressure chronic ulcer of other part of left lower leg limited to breakdown of skin: Secondary | ICD-10-CM

## 2015-08-23 DIAGNOSIS — L97821 Non-pressure chronic ulcer of other part of left lower leg limited to breakdown of skin: Secondary | ICD-10-CM

## 2015-08-30 ENCOUNTER — Ambulatory Visit: Payer: No Typology Code available for payment source | Attending: FAMILY PRACTICE | Admitting: FAMILY PRACTICE

## 2015-08-30 DIAGNOSIS — L97821 Non-pressure chronic ulcer of other part of left lower leg limited to breakdown of skin: Secondary | ICD-10-CM | POA: Insufficient documentation

## 2015-08-30 DIAGNOSIS — I87332 Chronic venous hypertension (idiopathic) with ulcer and inflammation of left lower extremity: Secondary | ICD-10-CM | POA: Insufficient documentation

## 2015-09-06 ENCOUNTER — Ambulatory Visit (HOSPITAL_BASED_OUTPATIENT_CLINIC_OR_DEPARTMENT_OTHER): Payer: No Typology Code available for payment source | Admitting: FAMILY PRACTICE

## 2015-09-06 DIAGNOSIS — I87332 Chronic venous hypertension (idiopathic) with ulcer and inflammation of left lower extremity: Secondary | ICD-10-CM | POA: Insufficient documentation

## 2015-09-06 DIAGNOSIS — L97821 Non-pressure chronic ulcer of other part of left lower leg limited to breakdown of skin: Secondary | ICD-10-CM

## 2015-09-13 ENCOUNTER — Ambulatory Visit (HOSPITAL_BASED_OUTPATIENT_CLINIC_OR_DEPARTMENT_OTHER): Payer: No Typology Code available for payment source | Admitting: FAMILY PRACTICE

## 2015-09-13 DIAGNOSIS — L97821 Non-pressure chronic ulcer of other part of left lower leg limited to breakdown of skin: Secondary | ICD-10-CM | POA: Insufficient documentation

## 2015-09-13 DIAGNOSIS — I87332 Chronic venous hypertension (idiopathic) with ulcer and inflammation of left lower extremity: Secondary | ICD-10-CM | POA: Insufficient documentation

## 2015-09-20 ENCOUNTER — Ambulatory Visit (HOSPITAL_BASED_OUTPATIENT_CLINIC_OR_DEPARTMENT_OTHER): Payer: No Typology Code available for payment source | Admitting: FAMILY PRACTICE

## 2015-09-20 DIAGNOSIS — L97821 Non-pressure chronic ulcer of other part of left lower leg limited to breakdown of skin: Secondary | ICD-10-CM

## 2015-09-27 ENCOUNTER — Ambulatory Visit (HOSPITAL_BASED_OUTPATIENT_CLINIC_OR_DEPARTMENT_OTHER): Payer: No Typology Code available for payment source | Admitting: FAMILY PRACTICE

## 2015-09-27 DIAGNOSIS — L97821 Non-pressure chronic ulcer of other part of left lower leg limited to breakdown of skin: Secondary | ICD-10-CM

## 2015-09-27 DIAGNOSIS — I87332 Chronic venous hypertension (idiopathic) with ulcer and inflammation of left lower extremity: Secondary | ICD-10-CM

## 2015-10-03 ENCOUNTER — Ambulatory Visit: Payer: No Typology Code available for payment source | Attending: FAMILY PRACTICE

## 2015-10-09 ENCOUNTER — Ambulatory Visit (INDEPENDENT_AMBULATORY_CARE_PROVIDER_SITE_OTHER): Payer: No Typology Code available for payment source | Admitting: Vascular Surgery

## 2015-10-09 ENCOUNTER — Encounter (INDEPENDENT_AMBULATORY_CARE_PROVIDER_SITE_OTHER): Payer: Self-pay | Admitting: Vascular Surgery

## 2015-10-09 ENCOUNTER — Ambulatory Visit: Payer: No Typology Code available for payment source | Attending: FAMILY PRACTICE

## 2015-10-09 VITALS — BP 174/76 | HR 80 | Ht 68.0 in | Wt 295.1 lb

## 2015-10-09 DIAGNOSIS — I872 Venous insufficiency (chronic) (peripheral): Secondary | ICD-10-CM

## 2015-10-09 NOTE — H&P (Signed)
[facility] of Vascular Surgery  Clinic History and Physical    Date: 10/09/2015  Patient: Rachel Mccall  DOB: [birth year]  [facility]    Chief Complaint:   Chief Complaint   Patient presents with    Check Up     there is a pin size place on the side of left ankle that is not healing        Subjective:     HPI: Rachel Mccall is a 60 y.o. White female who presents for evaluation of LLE swelling and wounds. This has been going on for one year, after a broken ankle, which required ORIF. The patient has had a wound on the lateral aspect of the ankle since that time. The patient denies history of DVT. The patient has been working with [facility] since that time and has been compliant with unna boot therapy. The patient denies family history of varicose veins.     PMH:   Past Medical History:   Diagnosis Date    Diabetes     Obesity     Thyroid disease            Family Hx:  Family History   Problem Relation Age of Onset    Diabetes Other     Thyroid Disease Other     Cancer Mother     Diabetes Father     Hypertension Father     Cancer Sister     Cancer Brother            Medications:  Current Outpatient Prescriptions   Medication Sig Dispense Refill    glipiZIDE (GLUCOTROL) 5 mg Tab take 5 mg by mouth Daily before Breakfast. Take 30 minutes before meals       levothyroxine (SYNTHROID) 75 mcg Tab Take 125 mcg by mouth Once a day       lisinopril (PRINIVIL) 2.5 mg Tab take 2.5 mg by mouth Once a day.      metformin (GLUCOPHAGE) 500 mg Tab Take 850 mg by mouth Twice daily with food        No current facility-administered medications for this visit.        Allergies:  Allergies   Allergen Reactions    Bactrim [Sulfamethoxazole-Trimethoprim]     Penicillins Rash       Past Surgical Hx:  Past Surgical History:   Procedure Laterality Date    HX ANKLE FRACTURE TX Left 2013    required ORIF    HX CATARACT REMOVAL      OD    HX TONSILLECTOMY             Social Hx:  Social  History     Social History    Marital status: Single     Spouse name: N/A    Number of children: N/A    Years of education: N/A     Occupational History     [employer]     Social History Main Topics    Smoking status: Never Smoker    Smokeless tobacco: Not on file    Alcohol use 0.5 oz/week     1 Standard drinks or equivalent per week    Drug use: No    Sexual activity: Not on file     Other Topics Concern    Not on file     Social History Narrative       Review of Systems:  Constitutional: negative for fevers, chills, sweats  and fatigue  HEENT: negative for hearing loss  Respiratory: negative for cough and shortness of breath  Cardiovascular: negative for chest pain, negative for claudication  Gastrointestinal: negative for post prandial pain  Genitourinary: negative for dysuria, continues to urinate  Musculoskeletal: As in HPI  Neurological: negative for unilateral weakness or monocular blindness  Integumentary: as in HPI    Objective:    PHYSICAL EXAM:  Vitals: BP (!) 174/76   Pulse 80   Ht 1.727 m (5\' 8" )   Wt 133.9 kg (295 lb 1.6 oz)   BMI 44.87 kg/m2  General: AA&O X3 Well developed and well nourished in no acute distress   HENT: Head is normocephalic, atraumatic   Eyes: Conjunctiva clear., Pupils equal and round. , Sclera non-icteric. EOMI  Neck: Normal ROM, Supple, symmetrical  Lungs: Effort normal, clear to auscultation bilaterally.   Cardiovascular: Heart regular rate and rhythm, S1, S2 normal, no murmur, click, rub or gallop  Vascular:    Left dorsalis pedis artery:  2+ (normal), Right dorsalis pedis artery:  2+ (normal),    Left posterior tibial artery: 2+ (normal) Right posterior tibeal artery:  2+ (normal)   Extremities: trace swelling noted bilaterally without obvious varicosities  Skin: 2x51mm left lateral malleolus wound, with fibrinous exudate at surface, no erythema or drainage   Neurologic: Grossly normal, no focal neuro deficit, normal coordination and gait  Psychiatric: normal  affect, behavior, memory, thought content, judgement, and speech    DATA:   I independently reviewed and interpreted the images.    DIAGNOSTIC STUDIES REVIEWED:  Patient had venous duplex at vein center in March, however the study was incomplete and did not assess the SSV      Assessment:  60 year old female with LLE non healing ulcer likely related to edema and venous insufficiency    Plan:  1) Follow up in 1 week with venous insufficiency study  2) Prescribed juxtafit compression  3) Discussed diabetic foot care with patient    Patient was given the opportunity to ask questions and those questions were answered to their satisfaction. Instructed to call with any further questions or concerns.     Troy Sine, MD

## 2015-10-16 ENCOUNTER — Encounter (INDEPENDENT_AMBULATORY_CARE_PROVIDER_SITE_OTHER): Payer: No Typology Code available for payment source | Admitting: Vascular Surgery

## 2015-10-16 ENCOUNTER — Encounter (HOSPITAL_COMMUNITY): Payer: Self-pay

## 2015-10-18 ENCOUNTER — Ambulatory Visit (HOSPITAL_BASED_OUTPATIENT_CLINIC_OR_DEPARTMENT_OTHER): Payer: No Typology Code available for payment source | Admitting: FAMILY PRACTICE

## 2015-10-18 DIAGNOSIS — L97821 Non-pressure chronic ulcer of other part of left lower leg limited to breakdown of skin: Secondary | ICD-10-CM | POA: Insufficient documentation

## 2015-10-18 DIAGNOSIS — I87332 Chronic venous hypertension (idiopathic) with ulcer and inflammation of left lower extremity: Secondary | ICD-10-CM | POA: Insufficient documentation

## 2015-10-23 ENCOUNTER — Encounter (INDEPENDENT_AMBULATORY_CARE_PROVIDER_SITE_OTHER): Payer: No Typology Code available for payment source | Admitting: Vascular Surgery

## 2015-10-24 ENCOUNTER — Ambulatory Visit (HOSPITAL_BASED_OUTPATIENT_CLINIC_OR_DEPARTMENT_OTHER): Payer: No Typology Code available for payment source | Admitting: FAMILY PRACTICE

## 2015-10-24 DIAGNOSIS — L97821 Non-pressure chronic ulcer of other part of left lower leg limited to breakdown of skin: Secondary | ICD-10-CM

## 2015-10-24 DIAGNOSIS — I87332 Chronic venous hypertension (idiopathic) with ulcer and inflammation of left lower extremity: Secondary | ICD-10-CM

## 2015-10-31 ENCOUNTER — Ambulatory Visit: Payer: No Typology Code available for payment source | Attending: FAMILY PRACTICE | Admitting: FAMILY PRACTICE

## 2015-10-31 DIAGNOSIS — L97821 Non-pressure chronic ulcer of other part of left lower leg limited to breakdown of skin: Secondary | ICD-10-CM

## 2015-11-07 ENCOUNTER — Ambulatory Visit (HOSPITAL_BASED_OUTPATIENT_CLINIC_OR_DEPARTMENT_OTHER): Payer: No Typology Code available for payment source | Admitting: FAMILY PRACTICE

## 2015-11-07 DIAGNOSIS — L97821 Non-pressure chronic ulcer of other part of left lower leg limited to breakdown of skin: Secondary | ICD-10-CM

## 2015-11-07 DIAGNOSIS — I87332 Chronic venous hypertension (idiopathic) with ulcer and inflammation of left lower extremity: Secondary | ICD-10-CM | POA: Insufficient documentation

## 2015-11-12 ENCOUNTER — Ambulatory Visit
Admission: RE | Admit: 2015-11-12 | Discharge: 2015-11-12 | Disposition: A | Discharge status: Home / Self Care | Payer: No Typology Code available for payment source | Source: Ambulatory Visit | Attending: Vascular Surgery | Admitting: Vascular Surgery

## 2015-11-12 DIAGNOSIS — I872 Venous insufficiency (chronic) (peripheral): Secondary | ICD-10-CM

## 2015-11-13 ENCOUNTER — Ambulatory Visit (INDEPENDENT_AMBULATORY_CARE_PROVIDER_SITE_OTHER): Payer: No Typology Code available for payment source | Admitting: Vascular Surgery

## 2015-11-13 ENCOUNTER — Encounter (INDEPENDENT_AMBULATORY_CARE_PROVIDER_SITE_OTHER): Payer: Self-pay | Admitting: Vascular Surgery

## 2015-11-13 ENCOUNTER — Ambulatory Visit (HOSPITAL_BASED_OUTPATIENT_CLINIC_OR_DEPARTMENT_OTHER): Payer: No Typology Code available for payment source | Admitting: FAMILY PRACTICE

## 2015-11-13 VITALS — BP 184/79 | HR 100 | Ht 68.0 in | Wt 297.1 lb

## 2015-11-13 DIAGNOSIS — L97821 Non-pressure chronic ulcer of other part of left lower leg limited to breakdown of skin: Secondary | ICD-10-CM

## 2015-11-13 DIAGNOSIS — R600 Localized edema: Principal | ICD-10-CM

## 2015-11-13 DIAGNOSIS — I87332 Chronic venous hypertension (idiopathic) with ulcer and inflammation of left lower extremity: Secondary | ICD-10-CM

## 2015-11-13 NOTE — Progress Notes (Signed)
Chief Complaint   Patient presents with    Check Up       History:  This is a 60 yo female who returns to clinic today to for follow up. 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 She has [facility] on weekly basis. She has been being treated with medihoney, border dressing and unna boots. Per the patient it seems to be slowly healing but hasn't completely heal. She goes back to the wound center today. She states she does not have medical grade compression stockings. No other complaints.    Denies any new changes in PMHx, PSxH, Social Hx, FHx or medications.     ROS:  General: Denies fever, chills, weightloss  HEENt: Denies vision changes  Cardio/ vasc: Denies chest pain, claudication  Respiratory: Denies SOB  Neuro: Denies amaurosis fugax, one sided weakness, slurred speech, facial droop  Ext: +lower extremity swelling, no varicose veins  Heme: denies easy bruising or prolonged bleeding    PE:   Vitals: BP (!) 184/79   Pulse 100   Ht 1.727 m (5\' 8" )   Wt 134.8 kg (297 lb 1.6 oz)   BMI 45.17 kg/m2  General: AA&O X3 Well developed and well nourished in no acute distress   HENT: Head is normocephalic, atraumatic   Neck: Normal ROM, Supple, symmetrical  Lungs: Effort normal  Cardiovascular: Heart regular rate and rhythm  Vascular:     Left dorsalis pedis artery:  2+ (normal), Right dorsalis pedis artery:  2+ (normal),     Left posterior tibial artery: 2+ (normal) Right posterior tibeal artery:  2+ (normal)   Extremities: trace swelling noted bilaterally without obvious varicosities  Skin: 2x62mm left lateral malleolus wound, with fibrinous exudate at surface, no erythema or drainage.  Neurologic: Grossly normal, no focal neuro deficit, normal coordination and gait    Imaging Results:   Venous insufficiency study  -no evidence of deep or superficial reflux in bilateral lower extremities    Assessment:  1. Bilateral lower extremity edema           PLAN:  -referral to lymphedema therapy with Ginny  -[facility]  -rx for juxtafit 30-40 mmHg compression given her nonhealing wound  -rx for 30 mmHg compression stockings.  -follow up PRN    The patient's examination and imaging were discussed with them today. Patient was given the opportunity to ask questions and those questions were answered to her satisfaction. The patient was seen in conjunction with cosigning physician.    Enzo BiIsaac Edmonds, PA-C  11/13/2015, 09:53    I personally saw and evaluated the patient. See APP's note for additional details. My findings/participation are:    No evidence of reflux of venous insufficiency study. Patient with pure lymphedema. Will refer to lymphedema clinic.   D/W wound clinic team.     Troy SineSamantha Elliannah Wayment, MD

## 2015-11-13 NOTE — Nursing Note (Signed)
Pt did not have a med list with them at visit - went over list with patient

## 2015-11-19 ENCOUNTER — Ambulatory Visit (HOSPITAL_BASED_OUTPATIENT_CLINIC_OR_DEPARTMENT_OTHER): Payer: No Typology Code available for payment source

## 2015-11-19 DIAGNOSIS — R600 Localized edema: Secondary | ICD-10-CM

## 2015-11-19 NOTE — Progress Notes (Signed)
Subjective Evaluation    History of Present Illness  Date of onset: 11/19/2015  Mechanism of injury: Patient had a previous wound left leg and was seen at wound care clinic for greater than 6months    Quality of life: good    Pain  Current pain rating: 0  At best pain rating: 0  At worst pain rating: 1  Location: in feet at times  Quality: dull ache  Relieving factors: change in position    Social Support  Lives in: [place] [facility]    Patient Goals  Patient goals for therapy: decreased edema             Objective     Static Posture     Comments  Patient girth measures as follows:    RLE    LLE  21 cm    21 cm   26 cm    25 cm   45cm     44cm   52cm    56 cm   49 cm   53cm                   Assessment & Plan     Assessment  Assessment details: Patient is referred for lymphedema of bilateral LE ,  and recent ulcers, weeping and wounds of the left lower extremity. Patient reports shaving over a scabbed area that became a wound last December 2016 . Patient is a complicated case and was most recently discharged from wound care and referred for lymphedema therapy and compression garments. At this time patient has tried elevation where some of the swelling will go down, and has used tubigrip .  She has fibrosis +1, and negative stemmer sign bilaterally, and pain of 1/10 with weight bearing and 0/10 at rest. She has no present compression garments to control this condition.   Please see patient history for additional information regarding medications, precautions.  Patient works full time in [place] and would like to obtain custom made compression garment to assist with controlling present STAGE II lymphedema. It is recommended patient to be seen 1d/wk X 4 week to address needs for compression, skin care , lymphedema precautions and self-management of present condition.       Prognosis: good  Prognosis details: Good for present needs     Goals     Independent with self-care management of STAGE II lymphedema by  discharge  Independent with wearing compression to assist in controlling this condition by discharge  Patient ot be independent with skin care education by discharge  Patient to be Indepedent with Lymphedema precautions by discharge  Patient to be independent with lymphedema HEP by discharge      Plan  Planned therapy interventions: compression  Frequency: 1x week  Duration in weeks: 4  Plan details: Patient has increased swelling, pain and changes in skin integrity where patient will benefit from lymphedema treatment with focus on reducing swelling, discomfort, address skin integrity though skin care education, compression, lymphedema precautions, and fitting for custom made compression garments. Patient is in agreement with this.       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 Patient has been seen at wound care center where she was been wrapped with una boot compression and used medihoney to assist with wound healing. AT this time patient is referred for lymphedema treatment to assist in controlling lymphedema and prevent  further wounds and help self-manage this condition. Patient is wrapped this day with tg grip E and then 1 shorts stretch 10 cm compression wrap from foot to 2 inches below the knee. Patient is instructed on wearing daily and off at night along with skin care education for daily application of aquaphor lotion to address skin integrity needs and review of lymphedema precautions. Due to the size and shape of the LLE it is recommended the patient to be measured and fit for a custom made compression garment in place of a OTC .   Patient is in agreement with this. She is scheduled to be measured on November 28, 2015 . Patient to continue with daily compression bandaging , skin care and following all lymphedema precautions until next  Appoitnment. Patient is in agreement with this.   Plan to continue as per  POC

## 2015-11-28 ENCOUNTER — Ambulatory Visit: Payer: No Typology Code available for payment source | Attending: FAMILY PRACTICE

## 2015-11-28 DIAGNOSIS — I890 Lymphedema, not elsewhere classified: Secondary | ICD-10-CM

## 2015-11-28 NOTE — Progress Notes (Signed)
Patient returns this day to address STAGE II Lymphedema of bilateral LE with wound of the LLE which healing is noted. Patient has been self-managing this condition with daily compression and continues using  Medihoney over the area.  Patient did engage in vasopneumatic device @40mgh  to  LLE X 30 minutes. Review of lymphedema precautions, skin care education and ongoing needs of comrpession. Patient is to   explore options of Juxta lite compression ready wrap for the LLE.   Patient is measured this day with 23.5 ankle 50 calf with 30 inch length. Due to insurance not covering these garments patient will obtain through [employer].   Measures Rachel Mccall were issue for her to order properly.   Patient is in agreement with this. She is scheduled to be measured on December 08, 2015 . Patient to continue with daily compression bandaging , skin care and following all   lymphedema precautions until next  Appoitnment in 2 weeks. Patient is in agreement with this.   Plan to continue as per POC

## 2015-12-10 ENCOUNTER — Ambulatory Visit (HOSPITAL_BASED_OUTPATIENT_CLINIC_OR_DEPARTMENT_OTHER): Payer: No Typology Code available for payment source

## 2016-06-13 ENCOUNTER — Ambulatory Visit
Admission: RE | Admit: 2016-06-13 | Discharge: 2016-06-13 | Disposition: A | Discharge status: Home / Self Care | Payer: MEDICAID | Source: Ambulatory Visit | Attending: PHYSICIAN ASSISTANT | Admitting: PHYSICIAN ASSISTANT

## 2016-06-13 ENCOUNTER — Other Ambulatory Visit (HOSPITAL_COMMUNITY): Payer: Self-pay | Admitting: PHYSICIAN ASSISTANT

## 2016-06-13 DIAGNOSIS — E119 Type 2 diabetes mellitus without complications: Secondary | ICD-10-CM

## 2016-06-13 DIAGNOSIS — E039 Hypothyroidism, unspecified: Secondary | ICD-10-CM

## 2016-06-13 DIAGNOSIS — E785 Hyperlipidemia, unspecified: Secondary | ICD-10-CM

## 2016-06-13 LAB — COMPREHENSIVE METABOLIC PNL, FASTING
ALBUMIN/GLOBULIN RATIO: 1.2 (ref 1.1–2.2)
ALBUMIN: 3.6 g/dL (ref 3.5–5.0)
ALKALINE PHOSPHATASE: 80 U/L (ref 38–126)
ALT (SGPT): 54 U/L (ref 14–54)
ANION GAP: 7 mmol/L (ref 5–15)
AST (SGOT): 41 U/L (ref 15–41)
BILIRUBIN TOTAL: 0.9 mg/dL (ref 0.3–1.2)
BUN/CREA RATIO: 23 — ABNORMAL HIGH (ref 6–20)
BUN/CREA RATIO: 23 — ABNORMAL HIGH (ref 6–20)
BUN: 27 mg/dL — ABNORMAL HIGH (ref 8–26)
CALCIUM: 9.1 mg/dL (ref 8.9–10.3)
CHLORIDE: 107 mmol/L (ref 101–111)
CHLORIDE: 107 mmol/L (ref 101–111)
CO2 TOTAL: 24 mmol/L (ref 22–32)
CREATININE: 1.18 mg/dL — ABNORMAL HIGH (ref 0.60–1.10)
ESTIMATED GFR: 46 mL/min/1.73mˆ2 — ABNORMAL LOW (ref >60)
GLUCOSE: 148 mg/dL — ABNORMAL HIGH (ref 70–110)
POTASSIUM: 4.8 mmol/L (ref 3.6–5.1)
PROTEIN TOTAL: 6.6 g/dL (ref 6.4–8.3)
SODIUM: 138 mmol/L (ref 136–144)

## 2016-06-13 LAB — THYROID STIMULATING HORMONE (SENSITIVE TSH)
TSH: 7.21 u[IU]/mL — ABNORMAL HIGH (ref 0.340–5.600)
TSH: 7.21 u[IU]/mL — ABNORMAL HIGH (ref 0.340–5.600)

## 2016-06-13 LAB — LIPID PANEL
CHOL/HDL RATIO: 3.2 (ref 0.0–4.4)
CHOLESTEROL: 163 mg/dL (ref 0–199)
HDL CHOL: 51 mg/dL (ref 38–85)
LDL CALC: 91 mg/dL (ref 0–100)
TRIGLYCERIDES: 105 mg/dL (ref 0–149)
VLDL CALC: 21 mg/dL (ref 6–49)
VLDL CALC: 21 mg/dL (ref 6–49)

## 2016-06-14 LAB — HGA1C (HEMOGLOBIN A1C WITH EST AVG GLUCOSE)
ESTIMATED AVERAGE GLUCOSE: 148 mg/dL
HEMOGLOBIN A1C: 6.8 % — ABNORMAL HIGH (ref 4.3–6.1)

## 2016-10-09 ENCOUNTER — Other Ambulatory Visit (HOSPITAL_COMMUNITY): Payer: Self-pay | Admitting: FAMILY PRACTICE

## 2016-10-09 DIAGNOSIS — N183 Chronic kidney disease, stage 3 unspecified (CMS HCC): Secondary | ICD-10-CM

## 2016-10-09 DIAGNOSIS — E119 Type 2 diabetes mellitus without complications: Secondary | ICD-10-CM

## 2016-10-09 DIAGNOSIS — E039 Hypothyroidism, unspecified: Secondary | ICD-10-CM

## 2016-10-13 ENCOUNTER — Other Ambulatory Visit
Admission: RE | Admit: 2016-10-13 | Discharge: 2016-10-13 | Disposition: A | Discharge status: Home / Self Care | Payer: MEDICAID | Source: Ambulatory Visit | Attending: FAMILY PRACTICE | Admitting: FAMILY PRACTICE

## 2016-10-13 DIAGNOSIS — E039 Hypothyroidism, unspecified: Secondary | ICD-10-CM

## 2016-10-13 DIAGNOSIS — N183 Chronic kidney disease, stage 3 unspecified (CMS HCC): Secondary | ICD-10-CM

## 2016-10-13 DIAGNOSIS — E119 Type 2 diabetes mellitus without complications: Secondary | ICD-10-CM

## 2016-10-13 LAB — COMPREHENSIVE METABOLIC PNL, FASTING
ALBUMIN/GLOBULIN RATIO: 1 — ABNORMAL LOW (ref 1.1–2.2)
ALBUMIN: 3.5 g/dL (ref 3.5–5.0)
ALKALINE PHOSPHATASE: 79 U/L (ref 38–126)
ANION GAP: 10 mmol/L (ref 5–15)
BILIRUBIN TOTAL: 0.7 mg/dL (ref 0.3–1.2)
BUN/CREA RATIO: 19 (ref 6–20)
CALCIUM: 8.8 mg/dL — ABNORMAL LOW (ref 8.9–10.3)
CHLORIDE: 104 mmol/L (ref 101–111)
CO2 TOTAL: 25 mmol/L (ref 22–32)
CREATININE: 1.01 mg/dL (ref 0.60–1.10)
ESTIMATED GFR: 56 mL/min/1.73mˆ2 — ABNORMAL LOW (ref >60)
GLUCOSE: 140 mg/dL — ABNORMAL HIGH (ref 70–110)
POTASSIUM: 4.7 mmol/L (ref 3.6–5.1)

## 2016-10-13 LAB — MICROALBUMIN/CREATININE RATIO, URINE, RANDOM
CREATININE RANDOM URINE: 162 mg/dL
MICROALBUMIN RANDOM URINE: 0.4 mg/dL (ref 0.2–1.9)
MICROALBUMIN/CREATININE RATIO RANDOM URINE: 2.5 mg/g (ref 0.0–30.0)

## 2016-10-13 LAB — HGA1C (HEMOGLOBIN A1C WITH EST AVG GLUCOSE)
ESTIMATED AVERAGE GLUCOSE: 126 mg/dL
HEMOGLOBIN A1C: 6 % (ref 4.3–6.1)

## 2016-10-13 LAB — THYROID STIMULATING HORMONE (SENSITIVE TSH): TSH: 2.9 u[IU]/mL (ref 0.340–5.600)

## 2017-08-21 ENCOUNTER — Other Ambulatory Visit (HOSPITAL_COMMUNITY): Payer: Self-pay | Admitting: FAMILY PRACTICE

## 2017-08-21 DIAGNOSIS — E782 Mixed hyperlipidemia: Secondary | ICD-10-CM

## 2017-08-21 DIAGNOSIS — E119 Type 2 diabetes mellitus without complications: Secondary | ICD-10-CM

## 2017-08-21 DIAGNOSIS — E039 Hypothyroidism, unspecified: Secondary | ICD-10-CM

## 2017-08-24 ENCOUNTER — Ambulatory Visit
Admission: RE | Admit: 2017-08-24 | Discharge: 2017-08-24 | Disposition: A | Discharge status: Home / Self Care | Payer: MEDICAID | Source: Ambulatory Visit | Attending: FAMILY PRACTICE | Admitting: FAMILY PRACTICE

## 2017-08-24 DIAGNOSIS — E039 Hypothyroidism, unspecified: Secondary | ICD-10-CM

## 2017-08-24 DIAGNOSIS — E782 Mixed hyperlipidemia: Secondary | ICD-10-CM

## 2017-08-24 DIAGNOSIS — E119 Type 2 diabetes mellitus without complications: Secondary | ICD-10-CM

## 2017-08-24 LAB — LIPID PANEL
CHOL/HDL RATIO: 3.6 (ref 0.0–4.4)
CHOLESTEROL: 217 mg/dL — ABNORMAL HIGH (ref 0–199)
HDL CHOL: 61 mg/dL (ref 38–85)
LDL CALC: 137 mg/dL — ABNORMAL HIGH (ref 0–100)
TRIGLYCERIDES: 95 mg/dL (ref 0–149)
VLDL CALC: 19 mg/dL (ref 6–49)

## 2017-08-24 LAB — COMPREHENSIVE METABOLIC PNL, FASTING
ALBUMIN/GLOBULIN RATIO: 1.2 (ref 1.1–2.2)
ALBUMIN: 3.6 g/dL (ref 3.5–5.0)
ALKALINE PHOSPHATASE: 86 U/L (ref 38–126)
ALT (SGPT): 30 U/L (ref 14–54)
ANION GAP: 7 mmol/L (ref 5–15)
AST (SGOT): 31 U/L (ref 15–41)
BILIRUBIN TOTAL: 0.6 mg/dL (ref 0.3–1.2)
BUN/CREA RATIO: 15 (ref 6–20)
BUN: 19 mg/dL (ref 8–26)
CALCIUM: 8.6 mg/dL — ABNORMAL LOW (ref 8.9–10.3)
CHLORIDE: 106 mmol/L (ref 101–111)
CO2 TOTAL: 25 mmol/L (ref 22–32)
CREATININE: 1.25 mg/dL — ABNORMAL HIGH (ref 0.60–1.10)
ESTIMATED GFR: 43 mL/min/1.73mˆ2 — ABNORMAL LOW (ref >60)
GLUCOSE: 107 mg/dL (ref 70–110)
POTASSIUM: 4.8 mmol/L (ref 3.6–5.1)
PROTEIN TOTAL: 6.6 g/dL (ref 6.4–8.3)
SODIUM: 138 mmol/L (ref 136–144)

## 2017-08-24 LAB — HGA1C (HEMOGLOBIN A1C WITH EST AVG GLUCOSE)
ESTIMATED AVERAGE GLUCOSE: 111 mg/dL
HEMOGLOBIN A1C: 5.5 % (ref 4.3–6.1)

## 2017-08-24 LAB — MICROALBUMIN/CREATININE RATIO, URINE, RANDOM
CREATININE RANDOM URINE: 285 mg/dL
MICROALBUMIN RANDOM URINE: 1.1 mg/dL (ref 0.2–1.9)
MICROALBUMIN/CREATININE RATIO RANDOM URINE: 3.9 mg/g (ref 0.0–30.0)

## 2017-08-24 LAB — THYROID STIMULATING HORMONE (SENSITIVE TSH): TSH: 1.014 u[IU]/mL (ref 0.340–5.600)

## 2017-09-14 ENCOUNTER — Other Ambulatory Visit (HOSPITAL_COMMUNITY): Payer: Self-pay | Admitting: Family

## 2017-09-14 DIAGNOSIS — R197 Diarrhea, unspecified: Secondary | ICD-10-CM

## 2017-09-22 ENCOUNTER — Other Ambulatory Visit
Admission: RE | Admit: 2017-09-22 | Discharge: 2017-09-22 | Disposition: A | Discharge status: Home / Self Care | Payer: MEDICAID | Source: Ambulatory Visit | Attending: Family | Admitting: Family

## 2017-09-22 DIAGNOSIS — R197 Diarrhea, unspecified: Principal | ICD-10-CM | POA: Insufficient documentation

## 2017-09-22 LAB — GIARDIA/CRYPTOSPORIDIUM
CRYPTOSPORIDIUM ANTIGEN: NEGATIVE
GIARDIA ANTIGEN: NEGATIVE

## 2017-09-22 LAB — CAMPYLOBACTER ANTIGEN: CAMPYLOBACTER EIA: NEGATIVE

## 2017-09-22 LAB — H.PYLORI ANTIGEN: H.PYLORI ANTIGEN: NOT DETECTED

## 2017-09-23 LAB — E. COLI SHIGA TOXIN
SHIGA TOXIN 1: NOT DETECTED
SHIGA TOXIN 2: NOT DETECTED

## 2017-09-23 LAB — C.DIFF TOXIN DNA: C.DIFF TOXIN DNA: NEGATIVE

## 2017-09-24 LAB — OVA AND PARASITE SCREEN: OVA & PARASITE SCREEN TRICHROME: NONE SEEN

## 2017-09-26 LAB — ROUTINE STOOL CULTURE (INCLUDING E. COLI SHIGA TOXIN): STOOL CULTURE: NORMAL

## 2017-10-05 ENCOUNTER — Other Ambulatory Visit (HOSPITAL_COMMUNITY): Payer: Self-pay | Admitting: FAMILY PRACTICE

## 2017-10-05 ENCOUNTER — Ambulatory Visit
Admission: RE | Admit: 2017-10-05 | Discharge: 2017-10-05 | Disposition: A | Discharge status: Home / Self Care | Payer: MEDICAID | Source: Ambulatory Visit | Attending: FAMILY PRACTICE | Admitting: FAMILY PRACTICE

## 2017-10-05 DIAGNOSIS — K529 Noninfective gastroenteritis and colitis, unspecified: Secondary | ICD-10-CM

## 2017-10-05 LAB — SEDIMENTATION RATE: ERYTHROCYTE SEDIMENTATION RATE (ESR): 22 mm/h (ref 0–30)

## 2017-10-05 LAB — AMYLASE: AMYLASE: 58 U/L (ref 28–100)

## 2017-10-07 ENCOUNTER — Other Ambulatory Visit
Admission: RE | Admit: 2017-10-07 | Discharge: 2017-10-07 | Disposition: A | Discharge status: Home / Self Care | Payer: MEDICAID | Source: Ambulatory Visit | Attending: FAMILY PRACTICE | Admitting: FAMILY PRACTICE

## 2017-10-07 DIAGNOSIS — K529 Noninfective gastroenteritis and colitis, unspecified: Secondary | ICD-10-CM | POA: Insufficient documentation

## 2017-10-07 LAB — CELIAC SCREENING PROFILE, IGA WITH REFLEX TO IGG, SERUM
GLIADIN (DEAMIDATED) ANTIBODY IGA QUALITATIVE: NEGATIVE
GLIADIN (DEAMIDATED) ANTIBODY IGA QUANTITATIVE: 0.7 U/mL (ref <15.0)
TISSUE TRANSGLUTAMINASE ANTIBODIES IGA QUANTITATIVE: <0.5 U/mL (ref <15.0)

## 2017-10-09 LAB — FIT (FECAL IMMUNOCHEMICAL TESTING): FIT TEST: POSITIVE — AB

## 2018-07-26 ENCOUNTER — Ambulatory Visit
Admission: RE | Admit: 2018-07-26 | Discharge: 2018-07-26 | Disposition: A | Discharge status: Home / Self Care | Payer: MEDICAID | Source: Ambulatory Visit | Attending: FAMILY PRACTICE | Admitting: FAMILY PRACTICE

## 2018-07-26 ENCOUNTER — Other Ambulatory Visit (HOSPITAL_COMMUNITY): Payer: Self-pay | Admitting: FAMILY PRACTICE

## 2018-07-26 DIAGNOSIS — E119 Type 2 diabetes mellitus without complications: Secondary | ICD-10-CM

## 2018-07-26 LAB — HGA1C (HEMOGLOBIN A1C WITH EST AVG GLUCOSE): HEMOGLOBIN A1C: 6.6 % — ABNORMAL HIGH (ref 4.3–6.1)

## 2018-12-13 ENCOUNTER — Other Ambulatory Visit (HOSPITAL_COMMUNITY): Payer: Self-pay | Admitting: FAMILY PRACTICE

## 2018-12-13 DIAGNOSIS — E119 Type 2 diabetes mellitus without complications: Secondary | ICD-10-CM

## 2018-12-13 DIAGNOSIS — E039 Hypothyroidism, unspecified: Secondary | ICD-10-CM

## 2018-12-13 DIAGNOSIS — E782 Mixed hyperlipidemia: Secondary | ICD-10-CM

## 2018-12-14 ENCOUNTER — Ambulatory Visit
Admission: RE | Admit: 2018-12-14 | Discharge: 2018-12-14 | Disposition: A | Discharge status: Home / Self Care | Payer: MEDICAID | Source: Ambulatory Visit | Attending: FAMILY PRACTICE | Admitting: FAMILY PRACTICE

## 2018-12-14 ENCOUNTER — Ambulatory Visit (HOSPITAL_COMMUNITY): Payer: MEDICAID | Admitting: FAMILY PRACTICE

## 2018-12-14 ENCOUNTER — Other Ambulatory Visit: Payer: Self-pay

## 2018-12-14 DIAGNOSIS — E119 Type 2 diabetes mellitus without complications: Secondary | ICD-10-CM | POA: Insufficient documentation

## 2018-12-14 DIAGNOSIS — E782 Mixed hyperlipidemia: Secondary | ICD-10-CM

## 2018-12-14 DIAGNOSIS — E039 Hypothyroidism, unspecified: Secondary | ICD-10-CM | POA: Insufficient documentation

## 2018-12-14 LAB — COMPREHENSIVE METABOLIC PNL, FASTING
ALBUMIN/GLOBULIN RATIO: 1.2 (ref 1.1–2.2)
ALBUMIN: 3.6 g/dL (ref 3.5–5.0)
ALKALINE PHOSPHATASE: 95 U/L (ref 38–126)
ALT (SGPT): 45 U/L (ref 14–54)
ANION GAP: 7 mmol/L (ref 5–15)
AST (SGOT): 43 U/L — ABNORMAL HIGH (ref 15–41)
BILIRUBIN TOTAL: 0.5 mg/dL (ref 0.3–1.2)
BUN/CREA RATIO: 19 (ref 6–20)
BUN: 22 mg/dL (ref 8–26)
CALCIUM: 8.7 mg/dL — ABNORMAL LOW (ref 8.9–10.3)
CHLORIDE: 102 mmol/L (ref 101–111)
CO2 TOTAL: 28 mmol/L (ref 22–32)
CREATININE: 1.17 mg/dL — ABNORMAL HIGH (ref 0.60–1.10)
ESTIMATED GFR: 49 mL/min/{1.73_m2} — ABNORMAL LOW (ref >60)
GLUCOSE: 169 mg/dL — ABNORMAL HIGH (ref 70–110)
POTASSIUM: 4.6 mmol/L (ref 3.6–5.1)
PROTEIN TOTAL: 6.6 g/dL (ref 6.4–8.3)
SODIUM: 137 mmol/L (ref 136–144)

## 2018-12-14 LAB — LIPID PANEL
CHOL/HDL RATIO: 3.9 (ref 0.0–4.4)
CHOLESTEROL: 247 mg/dL — ABNORMAL HIGH (ref 0–199)
HDL CHOL: 63 mg/dL (ref 38–85)
LDL CALC: 166 mg/dL — ABNORMAL HIGH (ref 0–100)
TRIGLYCERIDES: 89 mg/dL (ref 0–149)
VLDL CALC: 18 mg/dL (ref 6–49)

## 2018-12-14 LAB — HGA1C (HEMOGLOBIN A1C WITH EST AVG GLUCOSE)
ESTIMATED AVERAGE GLUCOSE: 169 mg/dL
HEMOGLOBIN A1C: 7.5 % — ABNORMAL HIGH (ref 4.3–6.1)

## 2018-12-14 LAB — MICROALBUMIN/CREATININE RATIO, URINE, RANDOM
CREATININE RANDOM URINE: 154 mg/dL
MICROALBUMIN RANDOM URINE: 0.5 mg/dL (ref 0.2–1.9)
MICROALBUMIN/CREATININE RATIO RANDOM URINE: 3.2 mg/g (ref 0.0–30.0)

## 2018-12-14 LAB — THYROID STIMULATING HORMONE (SENSITIVE TSH): TSH: 1.831 u[IU]/mL (ref 0.450–5.330)

## 2019-02-21 ENCOUNTER — Encounter (INDEPENDENT_AMBULATORY_CARE_PROVIDER_SITE_OTHER): Payer: Self-pay | Admitting: Vascular Surgery

## 2019-02-21 ENCOUNTER — Ambulatory Visit (INDEPENDENT_AMBULATORY_CARE_PROVIDER_SITE_OTHER): Payer: MEDICAID | Admitting: Vascular Surgery

## 2019-02-21 ENCOUNTER — Other Ambulatory Visit: Payer: Self-pay

## 2019-02-21 VITALS — BP 128/60 | HR 83 | Ht 68.0 in | Wt 259.0 lb

## 2019-02-21 DIAGNOSIS — I6523 Occlusion and stenosis of bilateral carotid arteries: Secondary | ICD-10-CM

## 2019-02-21 NOTE — Progress Notes (Signed)
[facility]  [address]  [place] [address]  Phone: [phone]  Fax: [phone]    Encounter Date: 02/21/2019    Patient ID:  Rachel Mccall  LOV:[id number]    DOB: 19-Feb-1955  Age: 64 y.o. female    Subjective:     Chief Complaint   Patient presents with   . Follow-up     room 6 for CAROTID ARTERY STENOSIS     HPI   Patient is a 64 y.o. female who was seen today for evaluation and treatment of carotid artery disease.  Patient presents today via referral from her primary care after having a carotid ultrasound performed at [facility].  She presented today with no complaints of CVA or TIA like symptoms.  She reported that a couple months ago she was told that she may have had a TIA.  She reported a an incident where she had some slurred speech.  She has had no further issues with slurred speech.  She has hyperlipidemia and is now on a statin.  She also has risk factor of diabetes.  We have previously seen her for venous insufficiency and venous ulcer.  She is had no vascular interventions in the past.    Current Outpatient Medications   Medication Sig   . atorvastatin (LIPITOR) 20 mg Oral Tablet Take 20 mg by mouth Every evening   . flecainide (TAMBOCOR) 100 mg Oral Tablet Take 100 mg by mouth Twice daily   . furosemide (LASIX) 20 mg Oral Tablet Take 20 mg by mouth Twice daily   . glipiZIDE (GLUCOTROL) 5 mg Tab take 5 mg by mouth Daily before Breakfast. Take 30 minutes before meals    . levothyroxine (SYNTHROID) 75 mcg Tab Take 125 mcg by mouth Once a day    . lisinopril (PRINIVIL) 2.5 mg Tab take 2.5 mg by mouth Once a day.   . meclizine (ANTIVERT) 25 mg Oral Tablet Take 25 mg by mouth Every 12 hours as needed   . metformin (GLUCOPHAGE) 500 mg Tab Take 850 mg by mouth Twice daily with food    . metoprolol tartrate (LOPRESSOR) 25 mg Oral Tablet Take 25 mg by mouth Twice daily 1/2 tab AM   . rivaroxaban (XARELTO) 20 mg Oral Tablet Take 20 mg by mouth Once  a day     Allergies   Allergen Reactions   . Bactrim [Sulfamethoxazole-Trimethoprim]    . Penicillins Rash     Past Medical History:   Diagnosis Date   . Diabetes    . Obesity    . Thyroid disease          Past Surgical History:   Procedure Laterality Date   . HX ANKLE FRACTURE TX Left 2013    required ORIF   . HX CATARACT REMOVAL      OD   . HX TONSILLECTOMY           Family Medical History:     Problem Relation (Age of Onset)    Cancer Mother, Sister, Brother    Diabetes Other, Father    Hypertension (High Blood Pressure) Father    Thyroid Disease Other            Social History     Tobacco Use   . Smoking status: Never Smoker   Substance Use Topics   . Alcohol use: Yes     Alcohol/week: 0.8 standard drinks     Types: 1 Standard  drinks or equivalent per week   . Drug use: No       Review of Systems   Review of  Systems:     Constitutional Negative for: Fever, Chills, Weight Loss     Skin Negative for: Rash, Itching     HENT Negative for: Headaches, Congestion, Nosebleeds     Eyes Negative for: Blurred Vision, Double Vision  Cardiovascular Positive for: Leg Swelling  Cardiovascular Negative for: Chest Pain, Claudication     Respiratory Negative for: Cough, Sputum Production, Shortness of Breath, Wheezing     Gastrointestinal Negative for: Heartburn, Nausea, Vomiting, Abdominal Pain, Diarrhea, Constipation, Blood in Stool     Genitourinary Negative for: Hematuria  Musculoskeletal: Joint Pain  Musculoskeletal Negative for: Neck Pain, Back Pain  Endo/Heme/Allergy Positive for: Easy Bruise/Bleed     Neurological Positive for: Dizziness  Neurological Negative for : Tingling, Sensory Change, Speech Change, Focal Weakness     Psychological Negative for: Depression, Suicidal Ideas, Substance Abuse, Nervoucs/Anxious, Insomnia                  Objective:   Vitals: BP 128/60   Pulse 83   Ht 1.727 m (5\' 8" )   Wt 117 kg (259 lb)   SpO2 98%   BMI 39.38 kg/m         Physical Exam  Constitutional:       Appearance: Normal  appearance. She is obese.   Cardiovascular:      Rate and Rhythm: Normal rate.      Pulses:           Carotid pulses are 0 on the right side and 0 on the left side.       Radial pulses are 2+ on the right side and 2+ on the left side.        Dorsalis pedis pulses are 1+ on the right side and 1+ on the left side.        Posterior tibial pulses are 1+ on the right side and 1+ on the left side.   Skin:     General: Skin is warm and dry.      Capillary Refill: Capillary refill takes less than 2 seconds.   Neurological:      General: No focal deficit present.      Mental Status: She is alert and oriented to person, place, and time. Mental status is at baseline.         Assessment & Plan:   Bilateral carotid artery stenosis  Plan:  Carotid duplex report obtain from [facility] that shows right ICA with 50-69% stenosis, left no significant stenosis noted.  We will schedule the patient for CT angiogram head and neck to evaluate her carotid artery disease.  She will follow up after her CT for results and treatment planning.  ENCOUNTER DIAGNOSES     ICD-10-CM   1. Bilateral carotid artery stenosis  I65.23       No orders of the defined types were placed in this encounter.      Return for after CT scan .    Venancio Poisson, NP

## 2019-04-15 ENCOUNTER — Ambulatory Visit
Admission: RE | Admit: 2019-04-15 | Discharge: 2019-04-15 | Disposition: A | Discharge status: Home / Self Care | Payer: Medicare Other | Source: Ambulatory Visit | Attending: PHYSICIAN ASSISTANT | Admitting: PHYSICIAN ASSISTANT

## 2019-04-15 ENCOUNTER — Other Ambulatory Visit (HOSPITAL_COMMUNITY): Payer: Self-pay | Admitting: PHYSICIAN ASSISTANT

## 2019-04-15 DIAGNOSIS — R0600 Dyspnea, unspecified: Secondary | ICD-10-CM

## 2019-05-10 ENCOUNTER — Other Ambulatory Visit (HOSPITAL_COMMUNITY): Payer: Self-pay | Admitting: FAMILY PRACTICE

## 2019-05-10 DIAGNOSIS — I4891 Unspecified atrial fibrillation: Secondary | ICD-10-CM

## 2019-05-10 DIAGNOSIS — E119 Type 2 diabetes mellitus without complications: Secondary | ICD-10-CM

## 2019-05-10 DIAGNOSIS — E039 Hypothyroidism, unspecified: Secondary | ICD-10-CM

## 2019-05-10 DIAGNOSIS — E782 Mixed hyperlipidemia: Secondary | ICD-10-CM

## 2019-05-12 ENCOUNTER — Ambulatory Visit
Admission: RE | Admit: 2019-05-12 | Discharge: 2019-05-12 | Disposition: A | Discharge status: Home / Self Care | Payer: Commercial Managed Care - PPO | Source: Ambulatory Visit | Attending: FAMILY PRACTICE | Admitting: FAMILY PRACTICE

## 2019-05-12 DIAGNOSIS — E119 Type 2 diabetes mellitus without complications: Secondary | ICD-10-CM | POA: Insufficient documentation

## 2019-05-12 DIAGNOSIS — E039 Hypothyroidism, unspecified: Secondary | ICD-10-CM | POA: Insufficient documentation

## 2019-05-12 DIAGNOSIS — I4891 Unspecified atrial fibrillation: Secondary | ICD-10-CM | POA: Insufficient documentation

## 2019-05-12 DIAGNOSIS — E782 Mixed hyperlipidemia: Secondary | ICD-10-CM | POA: Insufficient documentation

## 2019-05-12 LAB — MICROALBUMIN/CREATININE RATIO, URINE, RANDOM
CREATININE RANDOM URINE: 197 mg/dL — ABNORMAL HIGH (ref 50–100)
MICROALBUMIN RANDOM URINE: 2.9 mg/dL
MICROALBUMIN/CREATININE RATIO RANDOM URINE: 14.7 mg/g (ref <30.0)

## 2019-05-12 LAB — CBC WITH DIFF
BASOPHIL #: <0.1 10*3/uL (ref <=0.20)
BASOPHIL %: 1 %
EOSINOPHIL #: 0.23 10*3/uL (ref <=0.50)
EOSINOPHIL %: 4 %
HCT: 35 % (ref 34.8–46.0)
HGB: 10.7 g/dL — ABNORMAL LOW (ref 11.5–16.0)
IMMATURE GRANULOCYTE #: <0.1 10*3/uL (ref <0.10)
IMMATURE GRANULOCYTE %: 1 % (ref 0–1)
LYMPHOCYTE #: 1.17 10*3/uL (ref 1.00–4.80)
LYMPHOCYTE %: 19 %
MCH: 28.5 pg (ref 26.0–32.0)
MCHC: 30.6 g/dL — ABNORMAL LOW (ref 31.0–35.5)
MCV: 93.3 fL (ref 78.0–100.0)
MONOCYTE #: 0.47 10*3/uL (ref 0.20–1.10)
MONOCYTE %: 8 %
MPV: 12.4 fL (ref 8.7–12.5)
NEUTROPHIL #: 4.14 10*3/uL (ref 1.50–7.70)
NEUTROPHIL %: 67 %
PLATELETS: 150 10*3/uL (ref 150–400)
RBC: 3.75 10*6/uL — ABNORMAL LOW (ref 3.85–5.22)
RDW-CV: 14.2 % (ref 11.5–15.5)
WBC: 6.1 10*3/uL (ref 3.7–11.0)

## 2019-05-12 LAB — COMPREHENSIVE METABOLIC PNL, FASTING
ALBUMIN: 3.4 g/dL (ref 3.4–4.8)
ALKALINE PHOSPHATASE: 138 U/L — ABNORMAL HIGH (ref 50–130)
ALT (SGPT): 40 U/L — ABNORMAL HIGH (ref 8–22)
ANION GAP: 6 mmol/L (ref 4–13)
AST (SGOT): 40 U/L (ref 8–45)
BILIRUBIN TOTAL: 0.6 mg/dL (ref 0.3–1.3)
BUN/CREA RATIO: 14 (ref 6–22)
BUN: 15 mg/dL (ref 8–25)
CALCIUM: 8.2 mg/dL — ABNORMAL LOW (ref 8.8–10.2)
CHLORIDE: 101 mmol/L (ref 96–111)
CO2 TOTAL: 31 mmol/L (ref 23–31)
CREATININE: 1.06 mg/dL — ABNORMAL HIGH (ref 0.60–1.05)
ESTIMATED GFR: 55 mL/min/BSA — ABNORMAL LOW (ref >=60)
GLUCOSE: 174 mg/dL — ABNORMAL HIGH (ref 65–125)
POTASSIUM: 4.2 mmol/L (ref 3.5–5.1)
PROTEIN TOTAL: 6.9 g/dL (ref 6.0–8.0)
SODIUM: 138 mmol/L (ref 136–145)

## 2019-05-12 LAB — CBC/DIFF - CLIENT CONSOLIDATED
BASOPHIL #: <0.1 10*3/uL (ref <=0.20)
BASOPHIL %: 1 %
EOSINOPHIL #: 0.23 10*3/uL (ref <=0.50)
EOSINOPHIL %: 4 %
HCT: 35 % (ref 34.8–46.0)
HGB: 10.7 g/dL — ABNORMAL LOW (ref 11.5–16.0)
IMMATURE GRANULOCYTE #: <0.1 10*3/uL (ref <0.10)
IMMATURE GRANULOCYTE %: 1 % (ref 0–1)
LYMPHOCYTE #: 1.17 10*3/uL (ref 1.00–4.80)
LYMPHOCYTE %: 19 %
MCH: 28.5 pg (ref 26.0–32.0)
MCHC: 30.6 g/dL — ABNORMAL LOW (ref 31.0–35.5)
MCV: 93.3 fL (ref 78.0–100.0)
MONOCYTE #: 0.47 10*3/uL (ref 0.20–1.10)
MONOCYTE %: 8 %
MPV: 12.4 fL (ref 8.7–12.5)
NEUTROPHIL #: 4.14 10*3/uL (ref 1.50–7.70)
NEUTROPHIL %: 67 %
PLATELETS: 150 10*3/uL (ref 150–400)
RBC: 3.75 10*6/uL — ABNORMAL LOW (ref 3.85–5.22)
RDW-CV: 14.2 % (ref 11.5–15.5)
WBC: 6.1 10*3/uL (ref 3.7–11.0)

## 2019-05-12 LAB — LIPID PANEL
CHOL/HDL RATIO: 2.2
CHOLESTEROL: 141 mg/dL (ref 100–200)
HDL CHOL: 64 mg/dL (ref >=50)
LDL CALC: 61 mg/dL (ref <100)
NON-HDL: 77 mg/dL (ref <=190)
TRIGLYCERIDES: 79 mg/dL (ref <150)
VLDL CALC: 16 mg/dL (ref <30)

## 2019-05-12 LAB — THYROID STIMULATING HORMONE (SENSITIVE TSH): TSH: 1.409 u[IU]/mL (ref 0.430–3.550)

## 2019-05-12 LAB — HGA1C (HEMOGLOBIN A1C WITH EST AVG GLUCOSE)
ESTIMATED AVERAGE GLUCOSE: 189 mg/dL
HEMOGLOBIN A1C: 8.2 % — ABNORMAL HIGH (ref 4.3–6.1)

## 2019-05-23 ENCOUNTER — Ambulatory Visit
Admission: RE | Admit: 2019-05-23 | Discharge: 2019-05-23 | Disposition: A | Discharge status: Home / Self Care | Payer: Commercial Managed Care - PPO | Source: Ambulatory Visit | Attending: FAMILY PRACTICE | Admitting: FAMILY PRACTICE

## 2019-05-23 DIAGNOSIS — D649 Anemia, unspecified: Secondary | ICD-10-CM | POA: Insufficient documentation

## 2019-05-23 LAB — IRON TRANSFERRIN AND TIBC
IRON (TRANSFERRIN) SATURATION: 11 % — ABNORMAL LOW (ref 15–50)
IRON: 56 ug/dL (ref 45–170)
TOTAL IRON BINDING CAPACITY: 487 ug/dL — ABNORMAL HIGH (ref 224–476)
TRANSFERRIN: 348 mg/dL — ABNORMAL HIGH (ref 160–340)

## 2019-05-23 LAB — FERRITIN: FERRITIN: 23 ng/mL (ref 5–200)

## 2019-05-23 LAB — VITAMIN B12: VITAMIN B 12: 209 pg/mL (ref 200–900)

## 2019-05-23 LAB — FOLATE: FOLATE: 5.1 ng/mL — ABNORMAL LOW (ref 7.0–31.0)

## 2019-06-03 ENCOUNTER — Ambulatory Visit (HOSPITAL_BASED_OUTPATIENT_CLINIC_OR_DEPARTMENT_OTHER): Payer: Self-pay | Admitting: Medical

## 2019-06-23 ENCOUNTER — Other Ambulatory Visit: Payer: Self-pay

## 2019-06-23 ENCOUNTER — Ambulatory Visit: Payer: Commercial Managed Care - PPO | Attending: Otolaryngology | Admitting: Medical

## 2019-06-23 ENCOUNTER — Encounter (HOSPITAL_BASED_OUTPATIENT_CLINIC_OR_DEPARTMENT_OTHER): Payer: Self-pay

## 2019-06-23 ENCOUNTER — Ambulatory Visit (HOSPITAL_BASED_OUTPATIENT_CLINIC_OR_DEPARTMENT_OTHER): Payer: Commercial Managed Care - PPO | Admitting: Audiologist

## 2019-06-23 ENCOUNTER — Encounter (HOSPITAL_BASED_OUTPATIENT_CLINIC_OR_DEPARTMENT_OTHER): Payer: Self-pay | Admitting: Medical

## 2019-06-23 VITALS — Temp 97.3°F | Resp 18 | Ht 68.0 in | Wt 257.5 lb

## 2019-06-23 DIAGNOSIS — R42 Dizziness and giddiness: Secondary | ICD-10-CM | POA: Insufficient documentation

## 2019-06-23 DIAGNOSIS — Z8669 Personal history of other diseases of the nervous system and sense organs: Secondary | ICD-10-CM | POA: Insufficient documentation

## 2019-06-23 DIAGNOSIS — H8112 Benign paroxysmal vertigo, left ear: Secondary | ICD-10-CM

## 2019-06-23 NOTE — H&P (Signed)
ENT, Physician Office Building  [address]  [place] [address]  [phone]    Date: 06/23/2019  Name: Rachel Mccall  Age: 64 y.o.  DOB:  06-06-1955    Chief Complaint: Dizziness    History of Present Illness:     Rachel Mccall is a 64 y.o. female here today for evaluation of dizziness that started in March after she received her second COVID vaccine. Spinning is how the patient describes the symptom. This usually will last seconds. These episodes are associated with quick head movements and with rolling to the left in bed. During these episodes the patient does not notice a change/fluctuation in her hearing. The patient does notice ear fullness or tinnitus during these episodes.      These symptoms did not occur after an acute illness. The patient is not able to tell when an episode is about to occur. She has not fallen because of these dizzy spells. She does not have any other associated neurological symptoms around the time of these episodes. There is not a family history of migraines. The patient has had migraines in the past. The patient is diabetic, but denies any peripheral neuropathy of the lower extremities/feet. The patient has not had their vision checked within the last 3 months. The patient confirms double vision, blurry vision, or loss of vision. The patient denies a history of stroke or other neurologic injury. She has not had an ENG or VNG to evaluate this condition in the past. 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 Rachel Mccall will take meclizine and complete vestibular exercises at home as needed. She took meclizine this morning.    Past Medical History:     Past Medical History:   Diagnosis Date    Atrial fibrillation (CMS HCC)     Diabetes     Obesity     Thyroid disease          Past Surgical History:   Procedure Laterality Date    Hx ankle fracture tx Left 2013    Hx cataract removal      Hx tonsillectomy       Current Outpatient Medications      Medication Sig    atorvastatin (LIPITOR) 20 mg Oral Tablet Take 20 mg by mouth Every evening    flecainide (TAMBOCOR) 100 mg Oral Tablet Take 100 mg by mouth Twice daily    furosemide (LASIX) 20 mg Oral Tablet Take 20 mg by mouth Twice daily    glipiZIDE (GLUCOTROL) 5 mg Tab take 5 mg by mouth Daily before Breakfast. Take 30 minutes before meals  (Patient not taking: Reported on 06/23/2019)    levothyroxine (SYNTHROID) 75 mcg Tab Take 125 mcg by mouth Once a day     linaGLIPtin (TRADJENTA) 5 mg Oral Tablet Take 5 mg by mouth Once a day    lisinopril (PRINIVIL) 2.5 mg Tab take 2.5 mg by mouth Once a day. (Patient not taking: Reported on 06/23/2019)    meclizine (ANTIVERT) 25 mg Oral Tablet Take 25 mg by mouth Every 12 hours as needed    metformin (GLUCOPHAGE) 500 mg Tab Take 850 mg by mouth Twice daily with food     metoprolol tartrate (LOPRESSOR) 25 mg Oral Tablet Take 25 mg by mouth Twice daily 1/2 tab AM    oxymetazoline HCl (NASAL SPRAY MOISTURIZING NASL) by Nasal route    rivaroxaban (XARELTO) 20 mg Oral Tablet Take 20 mg by mouth Once  a day     Allergies   Allergen Reactions    Bactrim [Sulfamethoxazole-Trimethoprim]     Penicillins Rash     Family Medical History:       Problem Relation (Age of Onset)    Cancer Mother, Sister, Brother    Diabetes Other, Father    Hypertension (High Blood Pressure) Father    Thyroid Disease Other          Social History     Tobacco Use    Smoking status: Never Smoker    Smokeless tobacco: Never Used   Substance Use Topics    Alcohol use: Yes     Alcohol/week: 0.8 standard drinks     Types: 1 Standard drinks or equivalent per week      Review of Systems:     Constitutional: []  Fever; []  Chills []  Weight Loss;  []  Fatigue;  []  Other:  Eyes: []  Double Vision; []  Loss of Vision; []  Blurred Vision; []  Itchy/Watery Eyes; []  Other:  ENT: Negative for the remainder of the ENT review of systems except as documented in the HPI.  Cardiovascular: []  Chest Pain; []  Palpitations; []  Other:    Respiratory:  []  Wheezing; []  Shortness of Breath; []  Cough; []  Difficulty Breathing; []  Other:  Gastrointestinal: []  Nausea; []  Vomiting; []  Indigestion; []  Reflux; []  Other:  Genitourinary: []  Painful Urination; []  Other:  Musculoskeletal: []  Muscle Aches; []  Joint Aches; []  Other:   Integumentary: []  Rash; []  Hives; []  Other:  Neurological: []  Headache; []  Numbness; []  Tingling; []  Weakness; []  Other:  Endocrine: []  Hair Changes; []  Temp. Intolerances; []  Other:  Hematological: [x]  Easy Bruising; []  Easy Bleeding; []  Other:  Psychiatric: []  Anxiety; []  Depression; []  Other:     []  = negative  [x]  = positive    Physical Examination:     Temp 36.3 C (97.3 F) (Temporal)    Resp 18    Ht 1.727 m (5\' 8" )    Wt 117 kg (257 lb 8 oz)    BMI 39.15 kg/m     GENERAL:  Patient is in no acute distress.  HEAD:  Head is normocephalic, atraumatic. No palpable salivary gland masses.  FACE:  Face is symmetric, cranial nerve 7 is intact bilaterally.  EYES:  PERRL and Sclera non-icteric  EXTERNAL EARS:  Normal pinnae shape and position and No signs of inflammation  EXTERNAL AUDITORY CANAL:  LEFT - Patent, no evidence of inflammation  TYMPANIC MEMBRANE:  LEFT - Intact, healthy appearing and no evidence of middle ear effusion  EXTERNAL AUDITORY CANAL:  RIGHT - Patent, no evidence of inflammation  TYMPANIC MEMBRANE:  RIGHT - Intact, healthy appearing and no evidence of middle ear effusion  NOSE:  Externally the nose is straight and Internally the mucosa is healthy, no pus, polyps or bleeding spots noted  ORAL CAVITY:  Healthy appearing lips, tongue and gums and Oral mucosa is moist  OROPHARYNX:  Clear, no masses seen  HYPOPHARYNX:  Deferred  NECK:  Trachea is midline and No masses are palpable  THYROID:  no significant thyroid abnormality by palpation  LYMPH:  No cervical lymphadenopathy is palpable  NEURO:  Cranial nerves II-XII intact, Romberg - normal, Gait - normal, Tandem Gait - normal, Finger to nose - normal and Rapid  alternating movements - normal  SKIN:  Skin is warm and dry to touch.  RESPIRATORY:  No stridor.  CARDIOVASCULAR:  No peripheral cyanosis is noted.  MUSCULOSKELETAL:  Extremities move equally well.  PSYCHIATRIC:  Patient is pleasant, cooperative and alert.     Procedure: Marye Round and Epley Maneuver      Procedure:  Dix-Hallpike test and Epley maneuver  A Dix-Hallpike test was performed in clinic today by the audiologist. The patient was negative both ear(s). She did take meclizine this morning.    The Epley maneuver was performed once. The patient had some difficulty getting into the correct positions.     Assessment and Plan:     Rachel Mccall was seen today for dizziness.    Diagnoses and all orders for this visit:    Dizziness  The patient has history of BPPV and her symptoms are similar to what she has had in the past. She took meclizine this morning, but the audiologist went ahead and treated her with the Epley. She is going to discontinue her medication and return to the office next week if she needs additional evaluation and treatment. I asked her to call with questions or concerns she may have.  -     DIX HALPIKE (AMB ONLY)  -     EPLEY MANEUVER (AMB ONLY)       Follow up as needed.    I am scribing for, and in the presence of, [place], [place], for services provided on 06/23/2019.  Octavio Graves, LPN  7/79/3903, 00:92    I have reviewed and confirmed the ROS, [place], and all other elements documented by the SCRIBE. The scribed portion of the progress note was scribed on my behalf and at my direction. I have reviewed and attest to the accuracy of the note.  Claudean Kinds, PA-C  06/23/2019, 10:29        I have reviewed the H&P/ Findings/ Assessment/ Plan of the PA/ Resident/ Student/ NP & agree with the said documentation.    Toya Smothers, MD  06/28/2019 22:36

## 2019-06-29 ENCOUNTER — Ambulatory Visit: Payer: Commercial Managed Care - PPO | Attending: Audiologist | Admitting: Audiologist

## 2019-06-29 ENCOUNTER — Ambulatory Visit (HOSPITAL_BASED_OUTPATIENT_CLINIC_OR_DEPARTMENT_OTHER): Payer: Commercial Managed Care - PPO | Admitting: Audiologist

## 2019-06-29 ENCOUNTER — Other Ambulatory Visit: Payer: Self-pay

## 2019-06-29 DIAGNOSIS — H8112 Benign paroxysmal vertigo, left ear: Secondary | ICD-10-CM | POA: Insufficient documentation

## 2019-06-29 NOTE — Progress Notes (Signed)
Rachel Mccall was seen last week by Quentin Ore, PA-C. She has a history of BPPV and had taken meclizine that day. She returns today for a repeat Dix-Hallpike after being off of the meclizine for a few days. Today, the Piedad Climes was negative. Performed one Epley due to history. Encouraged her to contact our office if her spinning returns in the future.

## 2019-06-29 NOTE — Progress Notes (Signed)
Dix-Hallpike negative. Patient did take meclizine today. Hx of left BPPV. Treated with Epley due to history.

## 2019-07-26 ENCOUNTER — Other Ambulatory Visit (HOSPITAL_COMMUNITY): Payer: Self-pay | Admitting: FAMILY PRACTICE

## 2019-07-26 DIAGNOSIS — R898 Other abnormal findings in specimens from other organs, systems and tissues: Secondary | ICD-10-CM

## 2019-07-26 DIAGNOSIS — N183 Chronic kidney disease, stage 3 unspecified (CMS HCC): Secondary | ICD-10-CM

## 2019-07-26 DIAGNOSIS — E119 Type 2 diabetes mellitus without complications: Secondary | ICD-10-CM

## 2019-07-28 ENCOUNTER — Ambulatory Visit
Admission: RE | Admit: 2019-07-28 | Discharge: 2019-07-28 | Disposition: A | Discharge status: Home / Self Care | Payer: Commercial Managed Care - PPO | Source: Ambulatory Visit | Attending: FAMILY PRACTICE | Admitting: FAMILY PRACTICE

## 2019-07-28 DIAGNOSIS — N183 Chronic kidney disease, stage 3 unspecified: Secondary | ICD-10-CM | POA: Insufficient documentation

## 2019-07-28 DIAGNOSIS — E119 Type 2 diabetes mellitus without complications: Secondary | ICD-10-CM | POA: Insufficient documentation

## 2019-07-28 DIAGNOSIS — R898 Other abnormal findings in specimens from other organs, systems and tissues: Secondary | ICD-10-CM | POA: Insufficient documentation

## 2019-07-28 LAB — COMPREHENSIVE METABOLIC PNL, FASTING
ALBUMIN: 3.4 g/dL (ref 3.4–4.8)
ALKALINE PHOSPHATASE: 108 U/L (ref 50–130)
ALT (SGPT): 32 U/L — ABNORMAL HIGH (ref 8–22)
ANION GAP: 8 mmol/L (ref 4–13)
AST (SGOT): 38 U/L (ref 8–45)
BILIRUBIN TOTAL: 0.5 mg/dL (ref 0.3–1.3)
BUN/CREA RATIO: 16 (ref 6–22)
BUN: 19 mg/dL (ref 8–25)
CALCIUM: 9.1 mg/dL (ref 8.8–10.2)
CHLORIDE: 102 mmol/L (ref 96–111)
CO2 TOTAL: 29 mmol/L (ref 23–31)
CREATININE: 1.16 mg/dL — ABNORMAL HIGH (ref 0.60–1.05)
ESTIMATED GFR: 50 mL/min/BSA — ABNORMAL LOW (ref >=60)
GLUCOSE: 185 mg/dL — ABNORMAL HIGH (ref 65–125)
POTASSIUM: 4.2 mmol/L (ref 3.5–5.1)
PROTEIN TOTAL: 6.7 g/dL (ref 6.0–8.0)
SODIUM: 139 mmol/L (ref 136–145)

## 2019-07-28 LAB — FOLATE: FOLATE: 6 ng/mL — ABNORMAL LOW (ref 7.0–31.0)

## 2019-07-28 LAB — HGA1C (HEMOGLOBIN A1C WITH EST AVG GLUCOSE)
ESTIMATED AVERAGE GLUCOSE: 180 mg/dL
HEMOGLOBIN A1C: 7.9 % — ABNORMAL HIGH (ref 4.3–6.1)

## 2019-08-11 ENCOUNTER — Other Ambulatory Visit (HOSPITAL_COMMUNITY): Payer: Self-pay | Admitting: FAMILY PRACTICE

## 2019-08-11 ENCOUNTER — Ambulatory Visit
Admission: RE | Admit: 2019-08-11 | Discharge: 2019-08-11 | Disposition: A | Discharge status: Home / Self Care | Payer: Commercial Managed Care - PPO | Source: Ambulatory Visit | Attending: FAMILY PRACTICE | Admitting: FAMILY PRACTICE

## 2019-08-11 DIAGNOSIS — H8110 Benign paroxysmal vertigo, unspecified ear: Secondary | ICD-10-CM

## 2019-10-14 ENCOUNTER — Encounter (HOSPITAL_BASED_OUTPATIENT_CLINIC_OR_DEPARTMENT_OTHER): Payer: Self-pay

## 2019-11-11 ENCOUNTER — Other Ambulatory Visit (HOSPITAL_COMMUNITY): Payer: Self-pay | Admitting: FAMILY PRACTICE

## 2019-11-11 DIAGNOSIS — E119 Type 2 diabetes mellitus without complications: Secondary | ICD-10-CM

## 2019-12-19 ENCOUNTER — Ambulatory Visit
Admission: RE | Admit: 2019-12-19 | Discharge: 2019-12-19 | Disposition: A | Discharge status: Home / Self Care | Payer: Commercial Managed Care - PPO | Source: Ambulatory Visit | Attending: FAMILY PRACTICE | Admitting: FAMILY PRACTICE

## 2019-12-19 DIAGNOSIS — E119 Type 2 diabetes mellitus without complications: Secondary | ICD-10-CM | POA: Insufficient documentation

## 2019-12-19 LAB — BASIC METABOLIC PANEL, FASTING
ANION GAP: 9 mmol/L (ref 4–13)
BUN/CREA RATIO: 14 (ref 6–22)
BUN: 17 mg/dL (ref 8–25)
CALCIUM: 8.4 mg/dL — ABNORMAL LOW (ref 8.8–10.2)
CHLORIDE: 105 mmol/L (ref 96–111)
CO2 TOTAL: 26 mmol/L (ref 23–31)
CREATININE: 1.25 mg/dL — ABNORMAL HIGH (ref 0.60–1.05)
ESTIMATED GFR: 45 mL/min/BSA — ABNORMAL LOW (ref >=60)
GLUCOSE: 179 mg/dL — ABNORMAL HIGH (ref 70–99)
POTASSIUM: 4.8 mmol/L (ref 3.5–5.1)
SODIUM: 140 mmol/L (ref 136–145)

## 2020-02-06 ENCOUNTER — Other Ambulatory Visit (HOSPITAL_COMMUNITY): Payer: Self-pay | Admitting: FAMILY PRACTICE

## 2020-02-06 ENCOUNTER — Ambulatory Visit
Admission: RE | Admit: 2020-02-06 | Discharge: 2020-02-06 | Disposition: A | Discharge status: Home / Self Care | Payer: Commercial Managed Care - PPO | Source: Ambulatory Visit | Attending: FAMILY PRACTICE | Admitting: FAMILY PRACTICE

## 2020-02-06 DIAGNOSIS — N183 Chronic kidney disease, stage 3 unspecified (CMS HCC): Secondary | ICD-10-CM

## 2020-02-06 DIAGNOSIS — E119 Type 2 diabetes mellitus without complications: Secondary | ICD-10-CM

## 2020-02-06 LAB — BASIC METABOLIC PANEL
ANION GAP: 10 mmol/L (ref 4–13)
BUN/CREA RATIO: 14 (ref 6–22)
BUN: 18 mg/dL (ref 8–25)
CALCIUM: 9.4 mg/dL (ref 8.8–10.2)
CHLORIDE: 104 mmol/L (ref 96–111)
CO2 TOTAL: 26 mmol/L (ref 23–31)
CREATININE: 1.25 mg/dL — ABNORMAL HIGH (ref 0.60–1.05)
ESTIMATED GFR: 45 mL/min/BSA — ABNORMAL LOW (ref >=60)
GLUCOSE: 177 mg/dL — ABNORMAL HIGH (ref 65–125)
POTASSIUM: 4.9 mmol/L (ref 3.5–5.1)
SODIUM: 140 mmol/L (ref 136–145)

## 2020-02-06 LAB — HGA1C (HEMOGLOBIN A1C WITH EST AVG GLUCOSE)
ESTIMATED AVERAGE GLUCOSE: 151 mg/dL
HEMOGLOBIN A1C: 6.9 % — ABNORMAL HIGH (ref 4.3–6.1)

## 2020-05-22 ENCOUNTER — Ambulatory Visit
Admission: RE | Admit: 2020-05-22 | Discharge: 2020-05-22 | Disposition: A | Discharge status: Home / Self Care | Payer: Commercial Managed Care - PPO | Source: Ambulatory Visit | Attending: NURSE PRACTITIONER | Admitting: NURSE PRACTITIONER

## 2020-05-22 ENCOUNTER — Other Ambulatory Visit (HOSPITAL_COMMUNITY): Payer: Self-pay | Admitting: NURSE PRACTITIONER

## 2020-05-22 ENCOUNTER — Other Ambulatory Visit: Payer: Self-pay

## 2020-05-22 DIAGNOSIS — M25562 Pain in left knee: Secondary | ICD-10-CM

## 2020-08-16 ENCOUNTER — Other Ambulatory Visit (HOSPITAL_COMMUNITY): Payer: Self-pay | Admitting: FAMILY PRACTICE

## 2020-08-16 ENCOUNTER — Other Ambulatory Visit (HOSPITAL_COMMUNITY)
Admit: 2020-08-16 | Discharge: 2020-08-16 | Disposition: A | Discharge status: Home / Self Care | Payer: Self-pay | Attending: FAMILY PRACTICE | Admitting: FAMILY PRACTICE

## 2020-08-16 DIAGNOSIS — E039 Hypothyroidism, unspecified: Secondary | ICD-10-CM

## 2020-08-16 DIAGNOSIS — E1165 Type 2 diabetes mellitus with hyperglycemia: Secondary | ICD-10-CM

## 2020-08-16 DIAGNOSIS — I10 Essential (primary) hypertension: Secondary | ICD-10-CM

## 2020-08-16 DIAGNOSIS — E782 Mixed hyperlipidemia: Secondary | ICD-10-CM

## 2020-08-20 ENCOUNTER — Ambulatory Visit
Admission: RE | Admit: 2020-08-20 | Discharge: 2020-08-20 | Disposition: A | Discharge status: Home / Self Care | Payer: Commercial Managed Care - PPO | Source: Ambulatory Visit | Attending: FAMILY PRACTICE | Admitting: FAMILY PRACTICE

## 2020-08-20 DIAGNOSIS — I10 Essential (primary) hypertension: Secondary | ICD-10-CM | POA: Insufficient documentation

## 2020-08-20 DIAGNOSIS — E039 Hypothyroidism, unspecified: Secondary | ICD-10-CM | POA: Insufficient documentation

## 2020-08-20 DIAGNOSIS — E1165 Type 2 diabetes mellitus with hyperglycemia: Secondary | ICD-10-CM | POA: Insufficient documentation

## 2020-08-20 DIAGNOSIS — E782 Mixed hyperlipidemia: Secondary | ICD-10-CM | POA: Insufficient documentation

## 2020-08-20 LAB — COMPREHENSIVE METABOLIC PNL, FASTING
ALBUMIN: 3.4 g/dL (ref 3.4–4.8)
ALKALINE PHOSPHATASE: 94 U/L (ref 55–145)
ALT (SGPT): 22 U/L (ref 8–22)
ANION GAP: 7 mmol/L (ref 4–13)
AST (SGOT): 29 U/L (ref 8–45)
BILIRUBIN TOTAL: 0.6 mg/dL (ref 0.3–1.3)
BUN/CREA RATIO: 16 (ref 6–22)
BUN: 20 mg/dL (ref 8–25)
CALCIUM: 9 mg/dL (ref 8.8–10.2)
CHLORIDE: 106 mmol/L (ref 96–111)
CO2 TOTAL: 27 mmol/L (ref 23–31)
CREATININE: 1.24 mg/dL — ABNORMAL HIGH (ref 0.60–1.05)
ESTIMATED GFR: 48 mL/min/BSA — ABNORMAL LOW (ref >=60)
GLUCOSE: 141 mg/dL — ABNORMAL HIGH (ref 70–99)
POTASSIUM: 4.2 mmol/L (ref 3.5–5.1)
PROTEIN TOTAL: 6.9 g/dL (ref 6.0–8.0)
SODIUM: 140 mmol/L (ref 136–145)

## 2020-08-20 LAB — LIPID PANEL
CHOL/HDL RATIO: 2.3
CHOLESTEROL: 128 mg/dL (ref 100–200)
HDL CHOL: 55 mg/dL (ref >=50)
LDL CALC: 58 mg/dL (ref <100)
NON-HDL: 73 mg/dL (ref <=190)
TRIGLYCERIDES: 73 mg/dL (ref <150)
VLDL CALC: 15 mg/dL (ref <30)

## 2020-08-20 LAB — MICROALBUMIN/CREATININE RATIO, URINE, RANDOM
CREATININE RANDOM URINE: 171 mg/dL — ABNORMAL HIGH (ref 50–100)
MICROALBUMIN RANDOM URINE: 0.5 mg/dL
MICROALBUMIN/CREATININE RATIO RANDOM URINE: 2.9 mg/g (ref <30.0)

## 2020-08-20 LAB — THYROID STIMULATING HORMONE (SENSITIVE TSH): TSH: 1.128 u[IU]/mL (ref 0.430–3.550)

## 2020-08-21 LAB — HGA1C (HEMOGLOBIN A1C WITH EST AVG GLUCOSE)
ESTIMATED AVERAGE GLUCOSE: 157 mg/dL
HEMOGLOBIN A1C: 7.1 % — ABNORMAL HIGH (ref 4.3–6.1)

## 2020-09-26 ENCOUNTER — Other Ambulatory Visit (HOSPITAL_COMMUNITY): Payer: Self-pay | Admitting: FAMILY PRACTICE

## 2020-09-26 DIAGNOSIS — I779 Disorder of arteries and arterioles, unspecified: Secondary | ICD-10-CM

## 2020-10-09 ENCOUNTER — Other Ambulatory Visit (INDEPENDENT_AMBULATORY_CARE_PROVIDER_SITE_OTHER): Payer: Self-pay | Admitting: Vascular & Interventional Radiology

## 2020-10-09 DIAGNOSIS — I6523 Occlusion and stenosis of bilateral carotid arteries: Secondary | ICD-10-CM

## 2020-10-15 ENCOUNTER — Ambulatory Visit (HOSPITAL_COMMUNITY): Payer: Self-pay

## 2020-12-09 ENCOUNTER — Encounter (FREE_STANDING_LABORATORY_FACILITY): Payer: Commercial Managed Care - PPO | Attending: FAMILY PRACTICE | Admitting: FAMILY PRACTICE

## 2020-12-09 ENCOUNTER — Encounter (FREE_STANDING_LABORATORY_FACILITY): Payer: Commercial Managed Care - PPO | Admitting: FAMILY PRACTICE

## 2020-12-09 DIAGNOSIS — N390 Urinary tract infection, site not specified: Secondary | ICD-10-CM

## 2020-12-10 ENCOUNTER — Encounter (FREE_STANDING_LABORATORY_FACILITY)
Admit: 2020-12-10 | Discharge: 2020-12-10 | Disposition: A | Discharge status: Home / Self Care | Payer: MEDICAID | Attending: FAMILY PRACTICE | Admitting: FAMILY PRACTICE

## 2020-12-10 LAB — URINALYSIS, MICROSCOPIC
RBCS: 79 /hpf — ABNORMAL HIGH (ref <6.0)
WBCS: >182 /hpf — ABNORMAL HIGH (ref <11.0)

## 2020-12-11 LAB — URINE CULTURE: URINE CULTURE: >100000

## 2020-12-13 ENCOUNTER — Encounter (FREE_STANDING_LABORATORY_FACILITY)
Admit: 2020-12-13 | Discharge: 2020-12-13 | Disposition: A | Discharge status: Home / Self Care | Payer: MEDICAID | Attending: FAMILY PRACTICE | Admitting: FAMILY PRACTICE

## 2020-12-13 ENCOUNTER — Other Ambulatory Visit (FREE_STANDING_LABORATORY_FACILITY): Payer: Self-pay | Admitting: FAMILY PRACTICE

## 2020-12-13 LAB — H & H
HCT: 34.6 % — ABNORMAL LOW (ref 34.8–46.0)
HGB: 10.9 g/dL — ABNORMAL LOW (ref 11.5–16.0)

## 2020-12-27 ENCOUNTER — Other Ambulatory Visit: Payer: Commercial Managed Care - PPO | Attending: FAMILY PRACTICE | Admitting: FAMILY PRACTICE

## 2020-12-27 DIAGNOSIS — E119 Type 2 diabetes mellitus without complications: Secondary | ICD-10-CM | POA: Insufficient documentation

## 2020-12-27 DIAGNOSIS — E039 Hypothyroidism, unspecified: Secondary | ICD-10-CM | POA: Insufficient documentation

## 2020-12-27 DIAGNOSIS — D509 Iron deficiency anemia, unspecified: Secondary | ICD-10-CM | POA: Insufficient documentation

## 2020-12-27 DIAGNOSIS — E785 Hyperlipidemia, unspecified: Secondary | ICD-10-CM | POA: Insufficient documentation

## 2020-12-27 LAB — CBC WITH DIFF
BASOPHIL #: <0.1 10*3/uL (ref <=0.20)
BASOPHIL %: 1 %
EOSINOPHIL #: <0.1 10*3/uL (ref <=0.50)
EOSINOPHIL %: 0 %
HCT: 32.6 % — ABNORMAL LOW (ref 34.8–46.0)
HGB: 10.2 g/dL — ABNORMAL LOW (ref 11.5–16.0)
IMMATURE GRANULOCYTE #: 0.12 10*3/uL — ABNORMAL HIGH (ref <0.10)
IMMATURE GRANULOCYTE %: 1 % (ref 0–1)
LYMPHOCYTE #: 0.94 10*3/uL — ABNORMAL LOW (ref 1.00–4.80)
LYMPHOCYTE %: 11 %
MCH: 29.2 pg (ref 26.0–32.0)
MCHC: 31.3 g/dL (ref 31.0–35.5)
MCV: 93.4 fL (ref 78.0–100.0)
MONOCYTE #: 0.93 10*3/uL (ref 0.20–1.10)
MONOCYTE %: 11 %
MPV: 11.8 fL (ref 8.7–12.5)
NEUTROPHIL #: 6.64 10*3/uL (ref 1.50–7.70)
NEUTROPHIL %: 76 %
PLATELETS: 143 10*3/uL — ABNORMAL LOW (ref 150–400)
RBC: 3.49 10*6/uL — ABNORMAL LOW (ref 3.85–5.22)
RDW-CV: 16.3 % — ABNORMAL HIGH (ref 11.5–15.5)
WBC: 8.7 10*3/uL (ref 3.7–11.0)

## 2020-12-27 LAB — HGA1C (HEMOGLOBIN A1C WITH EST AVG GLUCOSE)
ESTIMATED AVERAGE GLUCOSE: 103 mg/dL
HEMOGLOBIN A1C: 5.2 % (ref 4.0–5.6)

## 2020-12-27 LAB — CBC/DIFF - CLIENT CONSOLIDATED
BASOPHIL #: <0.1 10*3/uL (ref <=0.20)
BASOPHIL %: 1 %
EOSINOPHIL #: <0.1 10*3/uL (ref <=0.50)
EOSINOPHIL %: 0 %
HCT: 32.6 % — ABNORMAL LOW (ref 34.8–46.0)
HGB: 10.2 g/dL — ABNORMAL LOW (ref 11.5–16.0)
IMMATURE GRANULOCYTE #: 0.12 10*3/uL — ABNORMAL HIGH (ref <0.10)
IMMATURE GRANULOCYTE %: 1 % (ref 0–1)
LYMPHOCYTE #: 0.94 10*3/uL — ABNORMAL LOW (ref 1.00–4.80)
LYMPHOCYTE %: 11 %
MCH: 29.2 pg (ref 26.0–32.0)
MCHC: 31.3 g/dL (ref 31.0–35.5)
MCV: 93.4 fL (ref 78.0–100.0)
MONOCYTE #: 0.93 10*3/uL (ref 0.20–1.10)
MONOCYTE %: 11 %
MPV: 11.8 fL (ref 8.7–12.5)
NEUTROPHIL #: 6.64 10*3/uL (ref 1.50–7.70)
NEUTROPHIL %: 76 %
PLATELETS: 143 10*3/uL — ABNORMAL LOW (ref 150–400)
RBC: 3.49 10*6/uL — ABNORMAL LOW (ref 3.85–5.22)
RDW-CV: 16.3 % — ABNORMAL HIGH (ref 11.5–15.5)
WBC: 8.7 10*3/uL (ref 3.7–11.0)

## 2020-12-27 LAB — BASIC METABOLIC PANEL
ANION GAP: 18 mmol/L — ABNORMAL HIGH (ref 4–13)
BUN/CREA RATIO: 12 (ref 6–22)
BUN: 58 mg/dL — ABNORMAL HIGH (ref 8–25)
CALCIUM: 9.1 mg/dL (ref 8.8–10.2)
CHLORIDE: 98 mmol/L (ref 96–111)
CO2 TOTAL: 18 mmol/L — ABNORMAL LOW (ref 23–31)
CREATININE: 4.93 mg/dL — ABNORMAL HIGH (ref 0.60–1.05)
ESTIMATED GFR: 9 mL/min/BSA — ABNORMAL LOW (ref >=60)
GLUCOSE: 102 mg/dL (ref 65–125)
POTASSIUM: 5.3 mmol/L — ABNORMAL HIGH (ref 3.5–5.1)
SODIUM: 134 mmol/L — ABNORMAL LOW (ref 136–145)

## 2020-12-27 LAB — LIPID PANEL
CHOL/HDL RATIO: 1.9
CHOLESTEROL: 112 mg/dL (ref 100–200)
HDL CHOL: 58 mg/dL (ref >=50)
LDL CALC: 37 mg/dL (ref <100)
NON-HDL: 54 mg/dL (ref <=190)
TRIGLYCERIDES: 87 mg/dL (ref <150)
VLDL CALC: 17 mg/dL (ref <30)

## 2020-12-27 LAB — THYROID STIMULATING HORMONE (SENSITIVE TSH): TSH: 0.146 u[IU]/mL — ABNORMAL LOW (ref 0.430–3.550)

## 2021-01-27 DEATH — deceased
